# Patient Record
Sex: Female | Born: 1935 | Race: White | Hispanic: No | Marital: Married | Smoking: Never smoker
Health system: Southern US, Community
[De-identification: ages and names within clinical notes are randomized; demographics above are authoritative.]

## PROBLEM LIST (undated history)

## (undated) DIAGNOSIS — J984 Other disorders of lung: Secondary | ICD-10-CM

## (undated) DIAGNOSIS — G4730 Sleep apnea, unspecified: Secondary | ICD-10-CM

## (undated) DIAGNOSIS — F32A Depression, unspecified: Secondary | ICD-10-CM

## (undated) DIAGNOSIS — M1990 Unspecified osteoarthritis, unspecified site: Secondary | ICD-10-CM

## (undated) DIAGNOSIS — R911 Solitary pulmonary nodule: Secondary | ICD-10-CM

## (undated) DIAGNOSIS — Z8719 Personal history of other diseases of the digestive system: Secondary | ICD-10-CM

## (undated) DIAGNOSIS — E559 Vitamin D deficiency, unspecified: Secondary | ICD-10-CM

## (undated) DIAGNOSIS — K2270 Barrett's esophagus without dysplasia: Secondary | ICD-10-CM

## (undated) DIAGNOSIS — R519 Headache, unspecified: Secondary | ICD-10-CM

## (undated) DIAGNOSIS — B029 Zoster without complications: Secondary | ICD-10-CM

## (undated) DIAGNOSIS — R1013 Epigastric pain: Secondary | ICD-10-CM

## (undated) DIAGNOSIS — E785 Hyperlipidemia, unspecified: Secondary | ICD-10-CM

## (undated) DIAGNOSIS — K635 Polyp of colon: Secondary | ICD-10-CM

## (undated) DIAGNOSIS — M549 Dorsalgia, unspecified: Secondary | ICD-10-CM

## (undated) DIAGNOSIS — G8929 Other chronic pain: Secondary | ICD-10-CM

## (undated) DIAGNOSIS — I10 Essential (primary) hypertension: Secondary | ICD-10-CM

## (undated) DIAGNOSIS — M8080XA Other osteoporosis with current pathological fracture, unspecified site, initial encounter for fracture: Secondary | ICD-10-CM

## (undated) DIAGNOSIS — M40209 Unspecified kyphosis, site unspecified: Secondary | ICD-10-CM

## (undated) DIAGNOSIS — R51 Headache: Secondary | ICD-10-CM

## (undated) DIAGNOSIS — S72009A Fracture of unspecified part of neck of unspecified femur, initial encounter for closed fracture: Secondary | ICD-10-CM

## (undated) DIAGNOSIS — F329 Major depressive disorder, single episode, unspecified: Secondary | ICD-10-CM

## (undated) DIAGNOSIS — K219 Gastro-esophageal reflux disease without esophagitis: Secondary | ICD-10-CM

## (undated) HISTORY — DX: Polyp of colon: K63.5

## (undated) HISTORY — DX: Fracture of unspecified part of neck of unspecified femur, initial encounter for closed fracture: S72.009A

## (undated) HISTORY — DX: Other osteoporosis with current pathological fracture, unspecified site, initial encounter for fracture: M80.80XA

## (undated) HISTORY — DX: Hyperlipidemia, unspecified: E78.5

## (undated) HISTORY — DX: Barrett's esophagus without dysplasia: K22.70

## (undated) HISTORY — DX: Vitamin D deficiency, unspecified: E55.9

## (undated) HISTORY — PX: ESOPHAGOGASTRODUODENOSCOPY (EGD) WITH ESOPHAGEAL DILATION: SHX5812

## (undated) HISTORY — DX: Other chronic pain: G89.29

## (undated) HISTORY — PX: APPENDECTOMY: SHX54

## (undated) HISTORY — PX: DILATION AND CURETTAGE OF UTERUS: SHX78

## (undated) HISTORY — DX: Solitary pulmonary nodule: R91.1

## (undated) HISTORY — PX: TONSILLECTOMY: SUR1361

## (undated) HISTORY — PX: CATARACT EXTRACTION W/ INTRAOCULAR LENS  IMPLANT, BILATERAL: SHX1307

## (undated) HISTORY — DX: Essential (primary) hypertension: I10

## (undated) HISTORY — DX: Zoster without complications: B02.9

## (undated) HISTORY — PX: CHOLECYSTECTOMY OPEN: SUR202

## (undated) HISTORY — PX: TOTAL ABDOMINAL HYSTERECTOMY: SHX209

## (undated) HISTORY — DX: Unspecified kyphosis, site unspecified: M40.209

## (undated) HISTORY — PX: VERTEBROPLASTY: SHX113

## (undated) HISTORY — DX: Gastro-esophageal reflux disease without esophagitis: K21.9

## (undated) HISTORY — DX: Epigastric pain: R10.13

## (undated) HISTORY — PX: FRACTURE SURGERY: SHX138

---

## 1971-05-27 HISTORY — PX: TUBAL LIGATION: SHX77

## 1997-09-04 ENCOUNTER — Other Ambulatory Visit: Admission: RE | Admit: 1997-09-04 | Discharge: 1997-09-04 | Payer: Self-pay | Admitting: Obstetrics and Gynecology

## 1998-08-15 ENCOUNTER — Other Ambulatory Visit: Admission: RE | Admit: 1998-08-15 | Discharge: 1998-08-15 | Payer: Self-pay | Admitting: Gastroenterology

## 1998-09-17 ENCOUNTER — Other Ambulatory Visit: Admission: RE | Admit: 1998-09-17 | Discharge: 1998-09-17 | Payer: Self-pay | Admitting: Obstetrics and Gynecology

## 1999-09-18 ENCOUNTER — Other Ambulatory Visit: Admission: RE | Admit: 1999-09-18 | Discharge: 1999-09-18 | Payer: Self-pay | Admitting: Gastroenterology

## 1999-09-18 ENCOUNTER — Encounter (INDEPENDENT_AMBULATORY_CARE_PROVIDER_SITE_OTHER): Payer: Self-pay | Admitting: Specialist

## 2000-07-16 ENCOUNTER — Encounter: Admission: RE | Admit: 2000-07-16 | Discharge: 2000-07-16 | Payer: Self-pay | Admitting: Family Medicine

## 2000-07-16 ENCOUNTER — Encounter: Payer: Self-pay | Admitting: Family Medicine

## 2000-10-15 ENCOUNTER — Other Ambulatory Visit: Admission: RE | Admit: 2000-10-15 | Discharge: 2000-10-15 | Payer: Self-pay | Admitting: Obstetrics and Gynecology

## 2000-12-23 ENCOUNTER — Encounter (INDEPENDENT_AMBULATORY_CARE_PROVIDER_SITE_OTHER): Payer: Self-pay | Admitting: Specialist

## 2000-12-23 ENCOUNTER — Other Ambulatory Visit: Admission: RE | Admit: 2000-12-23 | Discharge: 2000-12-23 | Payer: Self-pay | Admitting: Gastroenterology

## 2002-12-22 ENCOUNTER — Other Ambulatory Visit: Admission: RE | Admit: 2002-12-22 | Discharge: 2002-12-22 | Payer: Self-pay | Admitting: Obstetrics and Gynecology

## 2004-01-06 ENCOUNTER — Emergency Department (HOSPITAL_COMMUNITY): Admission: EM | Admit: 2004-01-06 | Discharge: 2004-01-06 | Payer: Self-pay | Admitting: Emergency Medicine

## 2004-01-08 ENCOUNTER — Encounter: Admission: RE | Admit: 2004-01-08 | Discharge: 2004-01-08 | Payer: Self-pay | Admitting: Gastroenterology

## 2004-01-25 ENCOUNTER — Ambulatory Visit (HOSPITAL_COMMUNITY): Admission: RE | Admit: 2004-01-25 | Discharge: 2004-01-25 | Payer: Self-pay | Admitting: Gastroenterology

## 2004-01-25 ENCOUNTER — Encounter (INDEPENDENT_AMBULATORY_CARE_PROVIDER_SITE_OTHER): Payer: Self-pay | Admitting: Specialist

## 2004-01-30 ENCOUNTER — Ambulatory Visit (HOSPITAL_COMMUNITY): Admission: RE | Admit: 2004-01-30 | Discharge: 2004-01-30 | Payer: Self-pay | Admitting: Gastroenterology

## 2004-02-07 ENCOUNTER — Encounter: Admission: RE | Admit: 2004-02-07 | Discharge: 2004-02-07 | Payer: Self-pay | Admitting: Orthopedic Surgery

## 2004-02-14 ENCOUNTER — Encounter (INDEPENDENT_AMBULATORY_CARE_PROVIDER_SITE_OTHER): Payer: Self-pay | Admitting: Specialist

## 2004-02-14 ENCOUNTER — Ambulatory Visit (HOSPITAL_COMMUNITY): Admission: RE | Admit: 2004-02-14 | Discharge: 2004-02-15 | Payer: Self-pay | Admitting: Interventional Radiology

## 2004-02-27 ENCOUNTER — Ambulatory Visit (HOSPITAL_COMMUNITY): Admission: RE | Admit: 2004-02-27 | Discharge: 2004-02-27 | Payer: Self-pay | Admitting: Interventional Radiology

## 2004-05-06 ENCOUNTER — Encounter: Admission: RE | Admit: 2004-05-06 | Discharge: 2004-05-06 | Payer: Self-pay | Admitting: Family Medicine

## 2004-05-26 DIAGNOSIS — R911 Solitary pulmonary nodule: Secondary | ICD-10-CM

## 2004-05-26 HISTORY — DX: Solitary pulmonary nodule: R91.1

## 2004-06-05 ENCOUNTER — Ambulatory Visit: Payer: Self-pay | Admitting: Gastroenterology

## 2004-09-12 ENCOUNTER — Ambulatory Visit: Payer: Self-pay | Admitting: Gastroenterology

## 2004-09-26 ENCOUNTER — Ambulatory Visit: Payer: Self-pay | Admitting: Gastroenterology

## 2004-11-13 ENCOUNTER — Encounter: Admission: RE | Admit: 2004-11-13 | Discharge: 2004-11-13 | Payer: Self-pay | Admitting: Family Medicine

## 2004-12-25 ENCOUNTER — Ambulatory Visit: Payer: Self-pay | Admitting: Gastroenterology

## 2004-12-30 ENCOUNTER — Ambulatory Visit (HOSPITAL_COMMUNITY): Admission: RE | Admit: 2004-12-30 | Discharge: 2004-12-30 | Payer: Self-pay | Admitting: Gastroenterology

## 2005-12-25 ENCOUNTER — Ambulatory Visit: Payer: Self-pay | Admitting: Gastroenterology

## 2006-01-30 ENCOUNTER — Encounter (INDEPENDENT_AMBULATORY_CARE_PROVIDER_SITE_OTHER): Payer: Self-pay | Admitting: Specialist

## 2006-01-30 ENCOUNTER — Ambulatory Visit: Payer: Self-pay | Admitting: Gastroenterology

## 2006-11-02 ENCOUNTER — Encounter: Admission: RE | Admit: 2006-11-02 | Discharge: 2006-11-02 | Payer: Self-pay | Admitting: Internal Medicine

## 2009-01-03 ENCOUNTER — Encounter (INDEPENDENT_AMBULATORY_CARE_PROVIDER_SITE_OTHER): Payer: Self-pay | Admitting: *Deleted

## 2009-02-14 ENCOUNTER — Encounter: Admission: RE | Admit: 2009-02-14 | Discharge: 2009-02-14 | Payer: Self-pay | Admitting: Gastroenterology

## 2009-06-26 ENCOUNTER — Encounter: Admission: RE | Admit: 2009-06-26 | Discharge: 2009-06-26 | Payer: Self-pay | Admitting: Internal Medicine

## 2009-09-21 ENCOUNTER — Encounter (INDEPENDENT_AMBULATORY_CARE_PROVIDER_SITE_OTHER): Payer: Self-pay | Admitting: *Deleted

## 2009-10-24 HISTORY — PX: ORIF HIP FRACTURE: SHX2125

## 2009-11-17 ENCOUNTER — Inpatient Hospital Stay (HOSPITAL_COMMUNITY): Admission: EM | Admit: 2009-11-17 | Discharge: 2009-11-20 | Payer: Self-pay | Admitting: Emergency Medicine

## 2010-01-15 ENCOUNTER — Encounter: Admission: RE | Admit: 2010-01-15 | Discharge: 2010-02-15 | Payer: Self-pay | Admitting: Specialist

## 2010-06-15 ENCOUNTER — Encounter: Payer: Self-pay | Admitting: Interventional Radiology

## 2010-06-25 NOTE — Letter (Signed)
Summary: Endo/Colon Letter  [facility]  [address] [place], [place] [address]   Phone: [phone]  Fax: [phone]      September 21, 2009 MRN: [id number]   [facility] [address] [place], [place]  [address]   Dear Ms. Toves,   According to your medical record, it is time for you to schedule an Endoscopy/Colonoscopy . Endoscopic screeening is recommended for patients with certain upper digestive tract conditions because of associated increased risk for cancers of the upper digestive system. The American Cancer Society recommends Colonoscopy as a method to detect early colon cancer. Patients with a family history of colon cancer, or a personal history of colon polyps or inflammatory bowel disease are at increased risk.  This letter has been generated based on the recommendations made at the time of your prior procedure. If you feel that in your particular situation this may no longer apply, please contact our office.  Please call our office at [phone] to schedule this appointment or to update your records at your earliest convenience.  Thank you for cooperating with [place] to provide you with the very best care possible.   Sincerely,  Judie Petit T. Russella Dar, M.D.  [facility] Gastro[facility]ology [facility] [phone]

## 2010-07-23 ENCOUNTER — Other Ambulatory Visit: Payer: Self-pay | Admitting: Gastroenterology

## 2010-08-11 LAB — CBC
HCT: 30.1 % — ABNORMAL LOW (ref 36.0–46.0)
HCT: 37.5 % (ref 36.0–46.0)
Hemoglobin: 10.5 g/dL — ABNORMAL LOW (ref 12.0–15.0)
Hemoglobin: 12.3 g/dL (ref 12.0–15.0)
MCH: 31.1 pg (ref 26.0–34.0)
MCH: 32.6 pg (ref 26.0–34.0)
MCH: 32.6 pg (ref 26.0–34.0)
MCHC: 32.8 g/dL (ref 30.0–36.0)
MCHC: 34.9 g/dL (ref 30.0–36.0)
MCHC: 35 g/dL (ref 30.0–36.0)
MCHC: 35.6 g/dL (ref 30.0–36.0)
MCV: 91.6 fL (ref 78.0–100.0)
MCV: 93.3 fL (ref 78.0–100.0)
MCV: 93.4 fL (ref 78.0–100.0)
MCV: 94.8 fL (ref 78.0–100.0)
Platelets: 131 10*3/uL — ABNORMAL LOW (ref 150–400)
Platelets: 147 10*3/uL — ABNORMAL LOW (ref 150–400)
Platelets: 176 10*3/uL (ref 150–400)
RBC: 3.23 MIL/uL — ABNORMAL LOW (ref 3.87–5.11)
RBC: 3.42 MIL/uL — ABNORMAL LOW (ref 3.87–5.11)
RDW: 12.6 % (ref 11.5–15.5)
RDW: 12.7 % (ref 11.5–15.5)
WBC: 10.1 10*3/uL (ref 4.0–10.5)
WBC: 6.9 10*3/uL (ref 4.0–10.5)

## 2010-08-11 LAB — URINE CULTURE: Culture: NO GROWTH

## 2010-08-11 LAB — BASIC METABOLIC PANEL
BUN: 21 mg/dL (ref 6–23)
BUN: 9 mg/dL (ref 6–23)
CO2: 27 mEq/L (ref 19–32)
CO2: 31 mEq/L (ref 19–32)
CO2: 31 mEq/L (ref 19–32)
Calcium: 8.3 mg/dL — ABNORMAL LOW (ref 8.4–10.5)
Calcium: 9 mg/dL (ref 8.4–10.5)
Calcium: 9.8 mg/dL (ref 8.4–10.5)
Chloride: 103 mEq/L (ref 96–112)
Chloride: 103 mEq/L (ref 96–112)
Creatinine, Ser: 0.88 mg/dL (ref 0.4–1.2)
GFR calc Af Amer: 56 mL/min — ABNORMAL LOW (ref >60)
GFR calc Af Amer: >60 mL/min (ref >60)
GFR calc Af Amer: >60 mL/min (ref >60)
Glucose, Bld: 100 mg/dL — ABNORMAL HIGH (ref 70–99)
Glucose, Bld: 105 mg/dL — ABNORMAL HIGH (ref 70–99)
Glucose, Bld: 113 mg/dL — ABNORMAL HIGH (ref 70–99)
Potassium: 3.8 mEq/L (ref 3.5–5.1)
Sodium: 138 mEq/L (ref 135–145)
Sodium: 139 mEq/L (ref 135–145)

## 2010-08-11 LAB — DIFFERENTIAL
Basophils Absolute: 0 10*3/uL (ref 0.0–0.1)
Eosinophils Absolute: 0 10*3/uL (ref 0.0–0.7)
Eosinophils Relative: 0 % (ref 0–5)
Lymphs Abs: 1.2 10*3/uL (ref 0.7–4.0)
Neutro Abs: 8.4 10*3/uL — ABNORMAL HIGH (ref 1.7–7.7)

## 2010-08-11 LAB — COMPREHENSIVE METABOLIC PANEL
Albumin: 2.8 g/dL — ABNORMAL LOW (ref 3.5–5.2)
Alkaline Phosphatase: 50 U/L (ref 39–117)
BUN: 8 mg/dL (ref 6–23)
Calcium: 8.5 mg/dL (ref 8.4–10.5)
Glucose, Bld: 95 mg/dL (ref 70–99)
Potassium: 3.7 mEq/L (ref 3.5–5.1)
Sodium: 137 mEq/L (ref 135–145)
Total Protein: 5.5 g/dL — ABNORMAL LOW (ref 6.0–8.3)

## 2010-08-11 LAB — TYPE AND SCREEN: ABO/RH(D): B NEG

## 2010-08-11 LAB — PROTIME-INR: Prothrombin Time: 13.5 seconds (ref 11.6–15.2)

## 2010-08-11 LAB — URINALYSIS, ROUTINE W REFLEX MICROSCOPIC
Ketones, ur: NEGATIVE mg/dL
Protein, ur: NEGATIVE mg/dL
Urobilinogen, UA: 0.2 mg/dL (ref 0.0–1.0)

## 2010-08-25 HISTORY — PX: TOE SURGERY: SHX1073

## 2010-08-25 HISTORY — PX: BUNIONECTOMY: SHX129

## 2010-10-11 NOTE — Assessment & Plan Note (Signed)
[facility]                           GASTROENTEROLOGY OFFICE NOTE   Kim Colon                       MRN:          [id number]  DATE:12/25/2005                            DOB:          10/24/1932    [name] returns for problems with burning post prandial epigastric pain  and occasional painful burning along her left costal margin into her  epigastrium which is not associated with meals or bowel movements.  It  occasionally is exacerbated by movement and relates to her ongoing back  pain.  She has no dysphagia, odynophagia, weight loss, or anorexia.  She  does have a history of Barrett esophagus, and she is very concerned about  esophageal cancer and gastric cancer.  The last upper endoscopy was  performed in September 2005, and showed a 5-cm hiatal hernia, no evidence of  Barrett's, and a small nodule which turned out to be chronic gastritis.  Colonoscopy was performed in May 2006, which showed diverticulosis and  internal hemorrhoids.   CURRENT MEDICATIONS:  Listed on the chart updated reviewed.   MEDICATION ALLERGIES:  1.  BIAXIN.  2.  PRILOSEC.  3.  NEXIUM.   PHYSICAL EXAMINATION:  GENERAL:  No acute distress, anxious.  VITAL SIGNS:  Weight 118 pounds up 4 pounds since August 2006, blood  pressure is 140/72, pulse 80 and regular.  CHEST:  Clear to auscultation bilaterally.  There is tenderness over her  left lateral and anterior costal margin.  CARDIAC:  Regular rate and rhythm without murmurs.  ABDOMEN:  Soft with mild epigastric tenderness to deep palpation.  No  rebound or guarding.  No palpable organomegaly, masses, or hernias.  Normoactive bowel sounds.   ASSESSMENT/PLAN:  1.  Post prandial burning epigastric pain.  This is likely related to      gastroesophageal reflux disease and her hiatal hernia.  Reassess for      Barrett esophagus, ulcer disease, erosive esophagitis, and other      disorders.  Continue Prevacid 30  mg orally twice a day along with      standard anti-reflux measures.  2.  Left costal margin pain that radiates to the epigastrium and is      associated with back pain.      This appears to be musculoskeletal in nature.  She will follow up with      Dr. Clelia Croft for further evaluation.                                   Venita Lick. Pleas Koch., MD, Clementeen Graham   MTS/MedQ  DD:  12/26/2005  DT:  12/26/2005  Job #:  [id number]   cc:   Kari Baars, MD

## 2010-12-13 ENCOUNTER — Telehealth: Payer: Self-pay

## 2010-12-13 NOTE — Telephone Encounter (Signed)
Called to patient to schedule Upper Endoscopy/Colonoscopy and patient states she switched GI care to Dr. Ewing Schlein. Note put in the system of GI switch.

## 2011-02-05 ENCOUNTER — Ambulatory Visit (HOSPITAL_COMMUNITY)
Admission: RE | Admit: 2011-02-05 | Discharge: 2011-02-05 | Disposition: A | Discharge status: Home / Self Care | Payer: Medicare Other | Source: Ambulatory Visit | Attending: Internal Medicine | Admitting: Internal Medicine

## 2011-02-05 DIAGNOSIS — R059 Cough, unspecified: Secondary | ICD-10-CM | POA: Insufficient documentation

## 2011-02-05 DIAGNOSIS — J988 Other specified respiratory disorders: Secondary | ICD-10-CM | POA: Insufficient documentation

## 2011-02-05 DIAGNOSIS — R0989 Other specified symptoms and signs involving the circulatory and respiratory systems: Secondary | ICD-10-CM | POA: Insufficient documentation

## 2011-02-05 DIAGNOSIS — R0602 Shortness of breath: Secondary | ICD-10-CM | POA: Insufficient documentation

## 2011-02-05 DIAGNOSIS — R05 Cough: Secondary | ICD-10-CM | POA: Insufficient documentation

## 2011-02-05 DIAGNOSIS — R0609 Other forms of dyspnea: Secondary | ICD-10-CM | POA: Insufficient documentation

## 2011-02-05 LAB — PULMONARY FUNCTION TEST

## 2011-07-29 ENCOUNTER — Encounter: Payer: Self-pay | Admitting: Cardiovascular Disease

## 2011-11-17 ENCOUNTER — Other Ambulatory Visit: Payer: Self-pay | Admitting: Gastroenterology

## 2011-11-17 ENCOUNTER — Ambulatory Visit
Admission: RE | Admit: 2011-11-17 | Discharge: 2011-11-17 | Disposition: A | Discharge status: Home / Self Care | Payer: Medicare Other | Source: Ambulatory Visit | Attending: Gastroenterology | Admitting: Gastroenterology

## 2011-11-17 DIAGNOSIS — R0602 Shortness of breath: Secondary | ICD-10-CM

## 2012-01-05 ENCOUNTER — Encounter: Payer: Self-pay | Admitting: *Deleted

## 2012-01-05 ENCOUNTER — Encounter: Payer: Self-pay | Admitting: Emergency Medicine

## 2012-01-05 ENCOUNTER — Ambulatory Visit (INDEPENDENT_AMBULATORY_CARE_PROVIDER_SITE_OTHER): Payer: Medicare Other | Admitting: Emergency Medicine

## 2012-01-05 VITALS — BP 154/82 | HR 99 | Temp 98.4°F | Ht <= 58 in | Wt 121.8 lb

## 2012-01-05 DIAGNOSIS — J984 Other disorders of lung: Secondary | ICD-10-CM | POA: Insufficient documentation

## 2012-01-05 DIAGNOSIS — K219 Gastro-esophageal reflux disease without esophagitis: Secondary | ICD-10-CM | POA: Insufficient documentation

## 2012-01-05 DIAGNOSIS — G4733 Obstructive sleep apnea (adult) (pediatric): Secondary | ICD-10-CM

## 2012-01-05 DIAGNOSIS — I10 Essential (primary) hypertension: Secondary | ICD-10-CM | POA: Insufficient documentation

## 2012-01-05 NOTE — Patient Instructions (Addendum)
Your oxygen level was normal with walking  We will perform a sleep study to assess your breathing at night while you sleep Follow with Dr Delton Coombes in 2 months or sooner if you have any problems.

## 2012-01-05 NOTE — Progress Notes (Signed)
Subjective:    Patient ID: Kim Colon, female    DOB: [birth year], 78 y.o.   MRN: [id number]  HPI 78 yo never smoker with hx compression fx's, GERD, HTN, hyperlipidemia. Per Dr Alver Fisher notes, also with a pulmonary nodule from 10/2004.  She has been having some episodes of sudden awakening, feeling like she isn't moving air. This started happening about a year ago. She does have a hx of reflux a moderate sized hiatal hernia, reflux has come into her mouth at night - but the episodes now don't feel the same, aren't associated with a sour taste, etc. She has SOB with exertion that has evolved gradually. Her PFT in 10/2010 showed no AFL, some mild restriction, decreased diffusion that corrects for Va. Her husband says she snores. She is tired during the day - never refreshed in the am.    Review of Systems  Constitutional: Positive for fatigue. Negative for fever, chills, diaphoresis, activity change, appetite change and unexpected weight change.  HENT: Positive for trouble swallowing. Negative for nosebleeds, congestion, sore throat, rhinorrhea, sneezing, voice change, postnasal drip and sinus pressure.   Eyes: Negative for photophobia and visual disturbance.  Respiratory: Positive for cough, choking, chest tightness, shortness of breath and wheezing. Negative for apnea and stridor.   Cardiovascular: Negative for chest pain.  Gastrointestinal: Positive for abdominal pain. Negative for nausea, vomiting, constipation and blood in stool.  Genitourinary: Negative for dysuria, urgency, frequency and difficulty urinating.  Musculoskeletal: Positive for back pain. Negative for joint swelling and gait problem.  Skin: Negative for rash.  Neurological: Positive for light-headedness. Negative for dizziness, tremors, seizures, weakness, numbness and headaches.  Hematological: Does not bruise/bleed easily.  Psychiatric/Behavioral: Negative for confusion, disturbed wake/sleep cycle and agitation. The patient is  nervous/anxious.        Objective:   Physical Exam Filed Vitals:   01/05/12 1526  BP: 154/82  Pulse: 99  Temp: 98.4 F (36.9 C)   Gen: Pleasant, short stature, kyphotic, in no distress,  normal affect  ENT: No lesions,  mouth clear,  oropharynx clear, no postnasal drip  Neck: No JVD, no TMG, no carotid bruits  Lungs: No use of accessory muscles, no dullness to percussion, clear without rales or rhonchi  Cardiovascular: RRR, heart sounds normal, no murmur, consistent S4  Musculoskeletal: No deformities, no cyanosis or clubbing  Neuro: alert, non focal  Skin: Warm, no lesions or rashes   PFT 02/05/11:  FEV1 1.39 (87%), FVC 1.74 (80%), ratio 80%; no BD response TLC 75%, RV 72% DLCO 62%, DLCO/VA 86%   CXR 11/17/11: Findings: Lung volumes are low. No acute consolidative airspace  disease. Linear area of scarring in the right middle lobe is  unchanged. No pleural effusions. Pulmonary vasculature is normal.  Heart size is normal. Moderate sized hiatal hernia. Mediastinal  contours are otherwise unremarkable. Lateral view demonstrates  diffuse osteopenia of the spine with multiple vertebral body  compression fractures (T7, T8, T10, T11, T12 and L1), with post  vertebroplasty changes noted in T7. Overall appearance is very  similar to the sagittal reconstructions from CT of the thorax dated  11/02/2006. Surgical clips project over the right upper quadrant  of the abdomen, consistent with prior cholecystectomy.  IMPRESSION:  1. No radiographic evidence of acute cardiopulmonary disease.  2. Small area of scarring in the right middle lobe is unchanged.  3. Moderate sized hiatal hernia.  4. Osteopenia with multiple vertebral body compression fractures  redemonstrated, as above.  5. Status post cholecystectomy.  Assessment & Plan:  Restrictive lung disease Likely due to her hiatal hernia and her evolving kyphosis. Unfortunately not much to be done to address these  underlying causes. No AFL on her PFT from last year, but she did have mild restriction at that time.  - will insure that she is adequately oxygenated today  Obstructive sleep apnea Suspected based on hx. Also consider effects of nocturnal GERD with the hiatal hernia - she believes the overt GERD sx are better - sleep study; will then need to discuss CPAP (she isn't thrilled at this prospect)

## 2012-01-05 NOTE — Assessment & Plan Note (Signed)
Likely due to her hiatal hernia and her evolving kyphosis. Unfortunately not much to be done to address these underlying causes. No AFL on her PFT from last year, but she did have mild restriction at that time.  - will insure that she is adequately oxygenated today

## 2012-01-05 NOTE — Assessment & Plan Note (Signed)
Suspected based on hx. Also consider effects of nocturnal GERD with the hiatal hernia - she believes the overt GERD sx are better - sleep study; will then need to discuss CPAP (she isn't thrilled at this prospect)

## 2012-01-12 ENCOUNTER — Ambulatory Visit (HOSPITAL_COMMUNITY)
Admission: RE | Admit: 2012-01-12 | Discharge: 2012-01-12 | Disposition: A | Discharge status: Home / Self Care | Payer: Medicare Other | Source: Ambulatory Visit | Attending: Gastroenterology | Admitting: Gastroenterology

## 2012-01-12 DIAGNOSIS — R1310 Dysphagia, unspecified: Secondary | ICD-10-CM | POA: Insufficient documentation

## 2012-01-12 NOTE — Procedures (Signed)
Objective Swallowing Evaluation: Modified Barium Swallowing Study  Patient Details  Name: Kim Colon MRN: [id number] Date of Birth: 06-Jan-1933  Today's Date: 01/12/2012 Time: 4098-1191 SLP Time Calculation (min): 25 min  Past Medical History:  Past Medical History  Diagnosis Date  . Osteoporosis with fracture     T12,T11, T10  . Hip fracture     left  . H/O vertebroplasty     T8  . Shingles   . Colon polyps   . Hyperlipemia   . HTN (hypertension)   . Vitamin D deficiency disease   . GERD (gastroesophageal reflux disease)   . Barrett esophagus   . Pulmonary nodule 2006  . Chronic epigastric pain   . Kyphosis    Past Surgical History:  Past Surgical History  Procedure Date  . Orif hip fracture 10/2009  . Cholecystectomy   . Appendectomy   . Total abdominal hysterectomy   . Bilateral salpingoophorectomy   . Bunionectomy 08/2010    Right foot  . Toe surgery    HPI:  Pt is a 78 year old female arriving for an outpatient MBS due to a history of GERD, irritation of throat, coughing with eating and drinking, Hiatal hernia, increased secretions/chest congestion. Pt referred by GI due to concerns that pt may be aspirating per report. Pt reports she has had episode of a sour tasking reflux coming up to her mouth and she sometimes feels irritated and has trouble breathing.      Assessment / Plan / Recommendation Clinical Impression  Dysphagia Diagnosis: Suspected primary esophageal dysphagia Clinical impression: Pt presents with a normal oral and oropharyngeal swallow. No aspiration or penetration observed. Esophageal sweep revealed previously diagnosed hiatal hernia, appearance of wide, open esophageal column. Given history and complaints pt with a high risk of laryngopharyngeal reflux. Esophageal precautions provided to pt. Recommend she continue a regular diet with thin liquids with f/u with Dr. Ewing Schlein.     Treatment Recommendation  No treatment recommended at this time      Diet Recommendation Regular;Thin liquid   Liquid Administration via: Cup;Straw Medication Administration: Whole meds with liquid Supervision: Patient able to self feed Compensations: Follow solids with liquid Postural Changes and/or Swallow Maneuvers: Seated upright 90 degrees;Upright 30-60 min after meal    Other  Recommendations     Follow Up Recommendations  None    Frequency and Duration        Pertinent Vitals/Pain NA    SLP Swallow Goals     General HPI: Pt is a 78 year old female arriving for an outpatient MBS due to a history of GERD, irritation of throat, coughing with eating and drinking, Hiatal hernia, increased secretions/chest congestion. Pt referred by GI due to concerns that pt may be aspirating per report. Pt reports she has had episode of a sour tasking reflux coming up to her mouth and she sometimes feels irritated and has trouble breathing.  Type of Study: Modified Barium Swallowing Study Reason for Referral: Objectively evaluate swallowing function Diet Prior to this Study: Regular;Thin liquids Temperature Spikes Noted: N/A Respiratory Status: Room air History of Recent Intubation: No Behavior/Cognition: Alert;Cooperative;Pleasant mood Oral Cavity - Dentition: Adequate natural dentition Oral Motor / Sensory Function: Within functional limits Self-Feeding Abilities: Able to feed self Patient Positioning: Upright in chair Baseline Vocal Quality: Clear Volitional Cough: Strong Volitional Swallow: Able to elicit Anatomy: Within functional limits Pharyngeal Secretions: Not observed secondary MBS    Reason for Referral Objectively evaluate swallowing function   Oral Phase  Pharyngeal Phase Pharyngeal Phase: Within functional limits   Cervical Esophageal Phase    GO Functional Assessment Tool Used: clinical judgement Functional Limitations: Swallowing Swallow Current Status ([id number]): At least 1 percent but less than 20 percent impaired, limited or  restricted Swallow Discharge Status [phone]): At least 1 percent but less than 20 percent impaired, limited or restricted  Cervical Esophageal Phase: Impaired (Prominent CP, asymptomatic.See clinical impression statement)    Kim Colon, Riley Nearing 01/12/2012, 1:18 PM

## 2012-02-02 ENCOUNTER — Encounter (HOSPITAL_BASED_OUTPATIENT_CLINIC_OR_DEPARTMENT_OTHER): Payer: Medicare Other

## 2012-02-04 ENCOUNTER — Other Ambulatory Visit: Payer: Self-pay | Admitting: Gastroenterology

## 2012-02-04 DIAGNOSIS — R109 Unspecified abdominal pain: Secondary | ICD-10-CM

## 2012-02-10 ENCOUNTER — Ambulatory Visit
Admission: RE | Admit: 2012-02-10 | Discharge: 2012-02-10 | Disposition: A | Discharge status: Home / Self Care | Payer: Medicare Other | Source: Ambulatory Visit | Attending: Gastroenterology | Admitting: Gastroenterology

## 2012-02-10 DIAGNOSIS — R109 Unspecified abdominal pain: Secondary | ICD-10-CM

## 2012-02-10 MED ORDER — IOHEXOL 300 MG/ML  SOLN
100.0000 mL | Freq: Once | INTRAMUSCULAR | Status: AC | PRN
  Administered 2012-02-10: 100 mL via INTRAVENOUS

## 2012-03-08 ENCOUNTER — Ambulatory Visit (INDEPENDENT_AMBULATORY_CARE_PROVIDER_SITE_OTHER): Payer: Medicare Other | Admitting: Emergency Medicine

## 2012-03-08 ENCOUNTER — Encounter: Payer: Self-pay | Admitting: Emergency Medicine

## 2012-03-08 VITALS — BP 140/80 | HR 85 | Temp 98.3°F | Ht 59.0 in | Wt 122.4 lb

## 2012-03-08 DIAGNOSIS — J984 Other disorders of lung: Secondary | ICD-10-CM

## 2012-03-08 DIAGNOSIS — G4733 Obstructive sleep apnea (adult) (pediatric): Secondary | ICD-10-CM

## 2012-03-08 NOTE — Progress Notes (Signed)
Subjective:    Patient ID: Kim Colon, female    DOB: November 24, 1932, 78 y.o.   MRN: [id number]  HPI 78 yo never smoker with hx compression fx's, GERD, HTN, hyperlipidemia. Per Dr Alver Fisher notes, also with a pulmonary nodule from 10/2004.  She has been having some episodes of sudden awakening, feeling like she isn't moving air. This started happening about a year ago. She does have a hx of reflux a moderate sized hiatal hernia, reflux has come into her mouth at night - but the episodes now don't feel the same, aren't associated with a sour taste, etc. She has SOB with exertion that has evolved gradually. Her PFT in 10/2010 showed no AFL, some mild restriction, decreased diffusion that corrects for Va. Her husband says she snores. She is tired during the day - never refreshed in the am.   ROV 03/08/12 -- follows for her nocturnal dyspnea, awakenings - restrictive dz, hiatal hernia. Swallowing eval since last time questions primary esophageal dysphagia, no overt aspiration. She hasn't had sleep study yet - has wondered if problems are primarily GI. Her nocturnal awakenings have decreased, following w Dr Ewing Schlein.    Review of Systems  Constitutional: Positive for fatigue. Negative for fever, chills, diaphoresis, activity change, appetite change and unexpected weight change.  HENT: Positive for trouble swallowing. Negative for nosebleeds, congestion, sore throat, rhinorrhea, sneezing, voice change, postnasal drip and sinus pressure.   Eyes: Negative for photophobia and visual disturbance.  Respiratory: Positive for cough, choking, chest tightness, shortness of breath and wheezing. Negative for apnea and stridor.   Cardiovascular: Negative for chest pain.  Gastrointestinal: Positive for abdominal pain. Negative for nausea, vomiting, constipation and blood in stool.  Genitourinary: Negative for dysuria, urgency, frequency and difficulty urinating.  Musculoskeletal: Positive for back pain. Negative for joint  swelling and gait problem.  Skin: Negative for rash.  Neurological: Positive for light-headedness. Negative for dizziness, tremors, seizures, weakness, numbness and headaches.  Hematological: Does not bruise/bleed easily.  Psychiatric/Behavioral: Negative for confusion, disturbed wake/sleep cycle and agitation. The patient is nervous/anxious.        Objective:   Physical Exam Filed Vitals:   03/08/12 0857  BP: 140/80  Pulse: 85  Temp: 98.3 F (36.8 C)   Gen: Pleasant, short stature, kyphotic, in no distress,  normal affect  ENT: No lesions,  mouth clear,  oropharynx clear, no postnasal drip  Neck: No JVD, no TMG, no carotid bruits  Lungs: No use of accessory muscles, no dullness to percussion, clear without rales or rhonchi  Cardiovascular: RRR, heart sounds normal, no murmur, consistent S4  Musculoskeletal: No deformities, no cyanosis or clubbing  Neuro: alert, non focal  Skin: Warm, no lesions or rashes   PFT 02/05/11:  FEV1 1.39 (87%), FVC 1.74 (80%), ratio 80%; no BD response TLC 75%, RV 72% DLCO 62%, DLCO/VA 86%   CXR 11/17/11: Findings: Lung volumes are low. No acute consolidative airspace  disease. Linear area of scarring in the right middle lobe is  unchanged. No pleural effusions. Pulmonary vasculature is normal.  Heart size is normal. Moderate sized hiatal hernia. Mediastinal  contours are otherwise unremarkable. Lateral view demonstrates  diffuse osteopenia of the spine with multiple vertebral body  compression fractures (T7, T8, T10, T11, T12 and L1), with post  vertebroplasty changes noted in T7. Overall appearance is very  similar to the sagittal reconstructions from CT of the thorax dated  11/02/2006. Surgical clips project over the right upper quadrant  of the abdomen, consistent  with prior cholecystectomy.  IMPRESSION:  1. No radiographic evidence of acute cardiopulmonary disease.  2. Small area of scarring in the right middle lobe is unchanged.    3. Moderate sized hiatal hernia.  4. Osteopenia with multiple vertebral body compression fractures  redemonstrated, as above.  5. Status post cholecystectomy.      Assessment & Plan:  Obstructive sleep apnea I still suspect this may be present, she believes it is more GI sx from her hiatal hernia. We agreed to defer PSG for now but to order it if she continues to have nocturnal awakenings, etc. She will call either me or Dr Clelia Croft.   Restrictive lung disease Little to do to modify this. Need to get her in best cardiopulm conditioning we can.

## 2012-03-08 NOTE — Assessment & Plan Note (Signed)
I still suspect this may be present, she believes it is more GI sx from her hiatal hernia. We agreed to defer PSG for now but to order it if she continues to have nocturnal awakenings, etc. She will call either me or Dr Clelia Croft.

## 2012-03-08 NOTE — Assessment & Plan Note (Signed)
Little to do to modify this. Need to get her in best cardiopulm conditioning we can.

## 2012-03-08 NOTE — Patient Instructions (Addendum)
Continue to follow with Dr Ewing Schlein regarding your hiatal hernia, swallowing  and reflux We will not order the sleep test right now. If you continue to wake up at night with choking then call either Dr Clelia Croft or Dr Delton Coombes and we will set it up.  Follow with Dr Delton Coombes as needed

## 2012-11-21 ENCOUNTER — Emergency Department (HOSPITAL_COMMUNITY)
Admission: EM | Admit: 2012-11-21 | Discharge: 2012-11-22 | Disposition: A | Discharge status: Home / Self Care | Payer: Medicare Other | Attending: Emergency Medicine | Admitting: Emergency Medicine

## 2012-11-21 ENCOUNTER — Encounter (HOSPITAL_COMMUNITY): Payer: Self-pay | Admitting: Emergency Medicine

## 2012-11-21 DIAGNOSIS — Z860100 Personal history of colon polyps, unspecified: Secondary | ICD-10-CM | POA: Insufficient documentation

## 2012-11-21 DIAGNOSIS — Y92009 Unspecified place in unspecified non-institutional (private) residence as the place of occurrence of the external cause: Secondary | ICD-10-CM | POA: Insufficient documentation

## 2012-11-21 DIAGNOSIS — G8929 Other chronic pain: Secondary | ICD-10-CM | POA: Insufficient documentation

## 2012-11-21 DIAGNOSIS — K2270 Barrett's esophagus without dysplasia: Secondary | ICD-10-CM | POA: Insufficient documentation

## 2012-11-21 DIAGNOSIS — Z8619 Personal history of other infectious and parasitic diseases: Secondary | ICD-10-CM | POA: Insufficient documentation

## 2012-11-21 DIAGNOSIS — M549 Dorsalgia, unspecified: Secondary | ICD-10-CM

## 2012-11-21 DIAGNOSIS — M542 Cervicalgia: Secondary | ICD-10-CM

## 2012-11-21 DIAGNOSIS — S22009A Unspecified fracture of unspecified thoracic vertebra, initial encounter for closed fracture: Secondary | ICD-10-CM | POA: Insufficient documentation

## 2012-11-21 DIAGNOSIS — R296 Repeated falls: Secondary | ICD-10-CM | POA: Insufficient documentation

## 2012-11-21 DIAGNOSIS — S0993XA Unspecified injury of face, initial encounter: Secondary | ICD-10-CM | POA: Insufficient documentation

## 2012-11-21 DIAGNOSIS — Z8781 Personal history of (healed) traumatic fracture: Secondary | ICD-10-CM | POA: Insufficient documentation

## 2012-11-21 DIAGNOSIS — Z8739 Personal history of other diseases of the musculoskeletal system and connective tissue: Secondary | ICD-10-CM | POA: Insufficient documentation

## 2012-11-21 DIAGNOSIS — Z79899 Other long term (current) drug therapy: Secondary | ICD-10-CM | POA: Insufficient documentation

## 2012-11-21 DIAGNOSIS — I10 Essential (primary) hypertension: Secondary | ICD-10-CM | POA: Insufficient documentation

## 2012-11-21 DIAGNOSIS — E559 Vitamin D deficiency, unspecified: Secondary | ICD-10-CM | POA: Insufficient documentation

## 2012-11-21 DIAGNOSIS — IMO0002 Reserved for concepts with insufficient information to code with codable children: Secondary | ICD-10-CM | POA: Insufficient documentation

## 2012-11-21 DIAGNOSIS — S22050A Wedge compression fracture of T5-T6 vertebra, initial encounter for closed fracture: Secondary | ICD-10-CM

## 2012-11-21 DIAGNOSIS — K219 Gastro-esophageal reflux disease without esophagitis: Secondary | ICD-10-CM | POA: Insufficient documentation

## 2012-11-21 DIAGNOSIS — S0990XA Unspecified injury of head, initial encounter: Secondary | ICD-10-CM | POA: Insufficient documentation

## 2012-11-21 DIAGNOSIS — Z862 Personal history of diseases of the blood and blood-forming organs and certain disorders involving the immune mechanism: Secondary | ICD-10-CM | POA: Insufficient documentation

## 2012-11-21 DIAGNOSIS — Z8639 Personal history of other endocrine, nutritional and metabolic disease: Secondary | ICD-10-CM | POA: Insufficient documentation

## 2012-11-21 DIAGNOSIS — W19XXXA Unspecified fall, initial encounter: Secondary | ICD-10-CM

## 2012-11-21 DIAGNOSIS — S199XXA Unspecified injury of neck, initial encounter: Secondary | ICD-10-CM | POA: Insufficient documentation

## 2012-11-21 DIAGNOSIS — Y9389 Activity, other specified: Secondary | ICD-10-CM | POA: Insufficient documentation

## 2012-11-21 DIAGNOSIS — S322XXA Fracture of coccyx, initial encounter for closed fracture: Secondary | ICD-10-CM | POA: Insufficient documentation

## 2012-11-21 DIAGNOSIS — S3210XA Unspecified fracture of sacrum, initial encounter for closed fracture: Secondary | ICD-10-CM | POA: Insufficient documentation

## 2012-11-21 NOTE — ED Notes (Signed)
Pt fell outside in garden, tripped over a dog. Pt c/o low back and sacral pain radiating up back. Pt able to stand w/ assistance.

## 2012-11-21 NOTE — ED Provider Notes (Signed)
History    CSN: [id number] Arrival date & time 11/21/12  2114  First MD Initiated Contact with Patient 11/21/12 2208     Chief Complaint  Patient presents with  . Tailbone Pain   (Consider location/radiation/quality/duration/timing/severity/associated sxs/prior Treatment) The history is provided by the patient and medical records. No language interpreter was used.    Kim Colon is a 79 y.o. female  with a hx of ORIF of the hip, osteoporosis, thoracic compression fractures  presents to the Emergency Department complaining of acute persistent neck and back pain.  Associated symptoms include headache and tailbone pain.  Pt states she was standing in the yard and when the dog tried to jump on her she fell backwards, landing flat on her back.  She is having pain in her neck and tailbone radiating up and down her spine.  Laying still makes it better and movement makes it significantly worse.  Pt denies fever, chills, LOC, chest pain, abd pain, SOB, N/V/D, weakness, dizziness, syncope.     Past Medical History  Diagnosis Date  . Osteoporosis with fracture     T12,T11, T10  . Hip fracture     left  . H/O vertebroplasty     T8  . Shingles   . Colon polyps   . Hyperlipemia   . HTN (hypertension)   . Vitamin D deficiency disease   . GERD (gastroesophageal reflux disease)   . Barrett esophagus   . Pulmonary nodule 2006  . Chronic epigastric pain   . Kyphosis    Past Surgical History  Procedure Laterality Date  . Orif hip fracture  10/2009  . Cholecystectomy    . Appendectomy    . Total abdominal hysterectomy    . Bilateral salpingoophorectomy    . Bunionectomy  08/2010    Right foot  . Toe surgery     Family History  Problem Relation Age of Onset  . Heart failure Father   . Cancer Father     prostate  . Heart attack Mother   . Parkinsonism Brother   . Stroke Sister   . Heart attack Sister   . Diabetes type II Sister   . Osteopenia Daughter   . Osteopenia Daughter     History  Substance Use Topics  . Smoking status: Never Smoker   . Smokeless tobacco: Never Used  . Alcohol Use: No   OB History   Grav Para Term Preterm Abortions TAB SAB Ect Mult Living                 Review of Systems  Constitutional: Negative for fever, diaphoresis, appetite change, fatigue and unexpected weight change.  HENT: Positive for neck pain. Negative for mouth sores and neck stiffness.   Eyes: Negative for visual disturbance.  Respiratory: Negative for cough, chest tightness, shortness of breath and wheezing.   Cardiovascular: Negative for chest pain.  Gastrointestinal: Negative for nausea, vomiting, abdominal pain, diarrhea and constipation.  Endocrine: Negative for polydipsia, polyphagia and polyuria.  Genitourinary: Negative for dysuria, urgency, frequency and hematuria.  Musculoskeletal: Positive for back pain and arthralgias.  Skin: Negative for rash.  Allergic/Immunologic: Negative for immunocompromised state.  Neurological: Positive for headaches. Negative for syncope and light-headedness.  Hematological: Does not bruise/bleed easily.  Psychiatric/Behavioral: Negative for sleep disturbance. The patient is not nervous/anxious.     Allergies  Biaxin  Home Medications   Current Outpatient Rx  Name  Route  Sig  Dispense  Refill  . acetaminophen (TYLENOL) 500 MG  tablet   Oral   Take 500 mg by mouth every 6 (six) hours as needed for pain.          Marland Kitchen alum & mag hydroxide-simeth (MAALOX/MYLANTA) 200-200-20 MG/5ML suspension   Oral   Take 5 mLs by mouth every 6 (six) hours as needed for indigestion.         . calcium carbonate (TUMS - DOSED IN MG ELEMENTAL CALCIUM) 500 MG chewable tablet   Oral   Chew 1 tablet by mouth daily as needed for heartburn.         . Calcium Carbonate-Vitamin D (CALCIUM-D) 600-400 MG-UNIT TABS   Oral   Take 1 capsule by mouth 2 (two) times daily.         . Cholecalciferol (VITAMIN D3) 1000 UNITS CAPS   Oral   Take 1  capsule by mouth 2 (two) times daily.         . clonazePAM (KLONOPIN) 0.5 MG tablet   Oral   Take 0.25 mg by mouth At bedtime as needed (sleep).          . fish oil-omega-3 fatty acids 1000 MG capsule   Oral   Take 1 g by mouth every morning.         . hydrochlorothiazide (HYDRODIURIL) 25 MG tablet   Oral   Take 25 mg by mouth every morning.          Marland Kitchen lisinopril (PRINIVIL,ZESTRIL) 10 MG tablet   Oral   Take 10 mg by mouth 2 (two) times daily.          . Multiple Vitamins-Minerals (COMPLETE MULTIVITAMIN/MINERAL PO)   Oral   Take 1 capsule by mouth every morning.          Marland Kitchen omeprazole (PRILOSEC) 40 MG capsule   Oral   Take 40 mg by mouth 2 (two) times daily.          Marland Kitchen pyridOXINE (VITAMIN B-6) 100 MG tablet   Oral   Take 100 mg by mouth every morning.          . vitamin B-12 (CYANOCOBALAMIN) 500 MCG tablet   Oral   Take 500 mcg by mouth every morning.          . Vitamin D, Ergocalciferol, (DRISDOL) 50000 UNITS CAPS   Oral   Take 50,000 Units by mouth every 30 (thirty) days.          BP 145/91  Pulse 99  Temp(Src) 99 F (37.2 C) (Oral)  Resp 20  SpO2 95% Physical Exam  Nursing note and vitals reviewed. Constitutional: She is oriented to person, place, and time. She appears well-developed and well-nourished. No distress.  HENT:  Head: Normocephalic and atraumatic.  Mouth/Throat: Oropharynx is clear and moist. No oropharyngeal exudate.  Eyes: Conjunctivae and EOM are normal. Pupils are equal, round, and reactive to light.  Neck: Neck supple. No rigidity. Decreased range of motion (with pain) present.  Cardiovascular: Normal rate, regular rhythm, normal heart sounds and intact distal pulses.   No murmur heard. Pulmonary/Chest: Effort normal and breath sounds normal. No respiratory distress. She has no wheezes.  Abdominal: Soft. She exhibits no distension. There is no tenderness.  Musculoskeletal: She exhibits tenderness. She exhibits no edema.   Full range of motion of the T-spine and L-spine Mild generalized tenderness to palpation of the spinous processes of the C-spine, T-spine and L-spine - no pinpoint pain. Small hematoma noted to the L flank; no pain to palpation in the area  Pt with significant pain on movement of her body from one position to the other Positive straight leg raise on the L and R   Lymphadenopathy:    She has no cervical adenopathy.  Neurological: She is alert and oriented to person, place, and time. She has normal reflexes. No cranial nerve deficit. She exhibits normal muscle tone. Coordination normal.  Speech is clear and goal oriented, follows commands Normal strength in upper and lower extremities bilaterally including dorsiflexion and plantar flexion, strong and equal grip strength Sensation normal to light and sharp touch Moves extremities without ataxia, coordination intact  Skin: Skin is warm and dry. No rash noted. She is not diaphoretic. No erythema.  Psychiatric: She has a normal mood and affect. Her behavior is normal.    ED Course  Procedures (including critical care time) Labs Reviewed - No data to display Ct Head Wo Contrast  11/22/2012   *RADIOLOGY REPORT*  Clinical Data:  Fall.  Head trauma.  Neck pain.  CT HEAD WITHOUT CONTRAST CT CERVICAL SPINE WITHOUT CONTRAST  Technique:  Multidetector CT imaging of the head and cervical spine was performed following the standard protocol without intravenous contrast.  Multiplanar CT image reconstructions of the cervical spine were also generated.  Comparison:   None  CT HEAD  Findings: Calvarium intact.  Paranasal sinuses and mastoid air cells are within normal limits. No mass lesion, mass effect, midline shift, hydrocephalus, hemorrhage.  No acute territorial cortical ischemia/infarct. Atrophy and chronic ischemic white matter disease is present.  Normal variant coaptation of the right occipital horn of the lateral ventricle.  IMPRESSION: No acute  intracranial abnormality.  Atrophy and chronic ischemic white matter disease.  CT CERVICAL SPINE  Findings: There is mild dextroconvex torticollis.  C5-C6 predominant degenerative disc disease is present.  Endplate cystic changes noted.  Posterior osseous ridging at C2-C3.  There is no cervical spine fracture or dislocation.  The odontoid is intact. Occipital condyles appear within normal limits.  Bilateral pleural apical scarring in the lungs.  IMPRESSION: No acute abnormality.  C5-C6 predominant cervical spondylosis.   Original Report Authenticated By: Andreas Newport, M.D.   1. Fall at home, initial encounter   2. Headache   3. Neck pain   4. Back pain   5. H/O compression fracture of spine     MDM  Mariajose S Pontiff presents with back pain after fall.  Concern for new fractures of the back. Will CT head and spine.  Pt declines pain medication several times.  Dr. Margarita Grizzle was consulted, evaluated this patient with me and agrees with the plan.    1:33 AM Imaging pending.  Discussed with Kaylyn Lim who will follow and dispo accordingly.      Dahlia Client Veretta Sabourin, PA-C 11/22/12 [phone]

## 2012-11-22 ENCOUNTER — Emergency Department (HOSPITAL_COMMUNITY): Payer: Medicare Other

## 2012-11-22 MED ORDER — HYDROCODONE-ACETAMINOPHEN 5-325 MG PO TABS: 1.0000 | ORAL_TABLET | ORAL | Status: DC | PRN

## 2012-11-22 MED ORDER — MORPHINE SULFATE 2 MG/ML IJ SOLN
2.0000 mg | Freq: Once | INTRAMUSCULAR | Status: AC
  Administered 2012-11-22: 2 mg via INTRAMUSCULAR
  Filled 2012-11-22: qty 1

## 2012-11-22 MED ORDER — SENNOSIDES-DOCUSATE SODIUM 8.6-50 MG PO TABS: 1.0000 | ORAL_TABLET | Freq: Every day | ORAL | Status: DC

## 2012-11-22 MED ORDER — ACETAMINOPHEN 500 MG PO TABS
1000.0000 mg | ORAL_TABLET | Freq: Once | ORAL | Status: AC
  Administered 2012-11-22: 1000 mg via ORAL
  Filled 2012-11-22: qty 2

## 2012-11-22 NOTE — ED Notes (Signed)
[employer] enroute. Estimate 2h to prepare the brace and arrive at the ED.

## 2012-11-22 NOTE — ED Notes (Signed)
Pt was able to ambulate but with two assist.  Pt sts she is still has back pain.  Pt sts she still feels unsteady without a walker or cane.

## 2012-11-22 NOTE — ED Notes (Signed)
[employer] here to place TLSO brace

## 2012-11-22 NOTE — ED Provider Notes (Signed)
History/physical exam/procedure(s) were performed by non-physician practitioner and as supervising physician I was immediately available for consultation/collaboration. I have reviewed all notes and am in agreement with care and plan.   Damen Windsor S Toyia Jelinek, MD 11/22/12 1205 

## 2012-11-22 NOTE — ED Notes (Addendum)
Ortho called. They stated that [employer] needed to come fit her for the brace. They have been notified.

## 2012-11-22 NOTE — ED Provider Notes (Signed)
Sign out from PA Muthersbaugh at shift change: Kim Colon is a 79 y.o. female past medical history significant for osteoporosis and thoracic compression fractures presenting to the ED with sacral, cervical and thoracic back pain status post slip and fall earlier in the day. Plan is to follow up CT imaging.  Addition to sacral fracture, there is a new T5 compression fracture with no retropulsion.   Patient can ambulate with assistance, her mobility is limited by her pain however she is very hesitant to take pain medicine because she "does not want to become addicted" and had a bad experience in the past for causes severe case of constipation. After convincing the patient to take acetaminophen and then morphine 2 mg IM she is able to ambulate with more ease. Patient will be cared for at home by her husband. I have advised her to use a walker. We have had extensive discussions of fall precautions.  Pt is hemodynamically stable, appropriate for, and amenable to discharge at this time. Pt verbalized understanding and agrees with care plan. Outpatient follow-up and specific return precautions discussed.    New Prescriptions   HYDROCODONE-ACETAMINOPHEN (NORCO/VICODIN) 5-325 MG PER TABLET    Take 1 tablet by mouth every 4 (four) hours as needed for pain.   SENNA-DOCUSATE (SENOKOT-S) 8.6-50 MG PER TABLET    Take 1 tablet by mouth daily.      Wynetta Emery, PA-C 11/22/12 [phone]

## 2012-11-22 NOTE — ED Provider Notes (Signed)
Medical screening examination/treatment/procedure(s) were performed by non-physician practitioner and as supervising physician I was immediately available for consultation/collaboration.   Dione Booze, MD 11/22/12 [phone]

## 2014-05-09 ENCOUNTER — Encounter: Payer: Self-pay | Admitting: Emergency Medicine

## 2014-05-09 ENCOUNTER — Ambulatory Visit (INDEPENDENT_AMBULATORY_CARE_PROVIDER_SITE_OTHER): Payer: Medicare Other | Admitting: Emergency Medicine

## 2014-05-09 VITALS — BP 128/72 | HR 78 | Ht 59.0 in | Wt 116.0 lb

## 2014-05-09 DIAGNOSIS — G4733 Obstructive sleep apnea (adult) (pediatric): Secondary | ICD-10-CM | POA: Diagnosis not present

## 2014-05-09 NOTE — Assessment & Plan Note (Signed)
High suspicion based on sx and anatomy. She would like to find out how much out of pocket cost before definitely performing. Will order today.

## 2014-05-09 NOTE — Progress Notes (Signed)
Subjective:    Patient ID: Kim Colon, female DOB: Apr 15, 1933, 80 y.o. MRN: [id number]  HPI 80 yo never smoker with hx compression fx's, GERD, HTN, hyperlipidemia. Per Dr Alver FisherShaw's notes, also with a pulmonary nodule from 10/2004. She has been having some episodes of sudden awakening, feeling like she isn't moving air. This started happening about a year ago. She does have a hx of reflux a moderate sized hiatal hernia, reflux has come into her mouth at night - but the episodes now don't feel the same, aren't associated with a sour taste, etc. She has SOB with exertion that has evolved gradually. Her PFT in 10/2010 showed no AFL, some mild restriction, decreased diffusion that corrects for Va. Her husband says she snores. She is tired during the day - never refreshed in the am.   ROV 03/08/12 -- follows for her nocturnal dyspnea, awakenings - restrictive dz, hiatal hernia. Swallowing eval since last time questions primary esophageal dysphagia, no overt aspiration. She hasn't had sleep study yet - has wondered if problems are primarily GI. Her nocturnal awakenings have decreased, following w Dr Ewing SchleinMagod.  ROV 05/09/14 -- follow up visit for nocturnal SOB. Last seen here in 02/2012.  She has restrictive lung disease, restriction and a hiatal hernia. Also, we have been suspicious for OSA > never had a PSG to date. She still has nocturnal awakenings, feels SOB when she wakes up at night. She does not taste  Or feel acid at those times. She was seen by [employer] nurse recommended to her that she follow up with us again. She states that she has SOB with bending and with some exertion.   Past Medical History  Diagnosis Date  . Osteoporosis with fracture     T12,T11, T10  . Hip fracture     left  . H/O vertebroplasty     T8  . Shingles   . Colon polyps   . Hyperlipemia   . HTN (hypertension)   . Vitamin D deficiency disease   . GERD (gastroesophageal reflux disease)   . Barrett esophagus    . Pulmonary nodule 2006  . Chronic epigastric pain   . Kyphosis      Family History  Problem Relation Age of Onset  . Heart failure Father   . Cancer Father     prostate  . Heart attack Mother   . Parkinsonism Brother   . Stroke Sister   . Heart attack Sister   . Diabetes type II Sister   . Osteopenia Daughter   . Osteopenia Daughter      History   Social History  . Marital Status: Married    Spouse Name: N/A    Number of Children: 3  . Years of Education: N/A   Occupational History  .      farming/housewife   Social History Main Topics  . Smoking status: Never Smoker   . Smokeless tobacco: Never Used  . Alcohol Use: No  . Drug Use: No  . Sexual Activity: Not on file   Other Topics Concern  . Not on file   Social History Narrative     Allergies  Allergen Reactions  . Amlodipine     Ankle swelling  . Biaxin [Clarithromycin] Nausea Only     Outpatient Prescriptions Prior to Visit  Medication Sig Dispense Refill  . acetaminophen (TYLENOL) 500 MG tablet Take 500 mg by mouth every 6 (six) hours as needed for pain.     Marland Kitchen. alum &  mag hydroxide-simeth (MAALOX/MYLANTA) 200-200-20 MG/5ML suspension Take 5 mLs by mouth every 6 (six) hours as needed for indigestion.    . calcium carbonate (TUMS - DOSED IN MG ELEMENTAL CALCIUM) 500 MG chewable tablet Chew 1 tablet by mouth daily as needed for heartburn.    . Calcium Carbonate-Vitamin D (CALCIUM-D) 600-400 MG-UNIT TABS Take 1 capsule by mouth 2 (two) times daily.    . clonazePAM (KLONOPIN) 0.5 MG tablet Take 0.25 mg by mouth At bedtime as needed (sleep).     . fish oil-omega-3 fatty acids 1000 MG capsule Take 1 g by mouth every morning.    . hydrochlorothiazide (HYDRODIURIL) 25 MG tablet Take 25 mg by mouth every morning.     Marland Kitchen. lisinopril (PRINIVIL,ZESTRIL) 10 MG tablet Take 10 mg by mouth 2 (two) times daily.     . Multiple Vitamins-Minerals (COMPLETE MULTIVITAMIN/MINERAL PO) Take 1 capsule by mouth every morning.      Marland Kitchen. omeprazole (PRILOSEC) 40 MG capsule Take 40 mg by mouth daily.     Marland Kitchen. pyridOXINE (VITAMIN B-6) 100 MG tablet Take 100 mg by mouth every morning.     . vitamin B-12 (CYANOCOBALAMIN) 500 MCG tablet Take 500 mcg by mouth every morning.     . Vitamin D, Ergocalciferol, (DRISDOL) 50000 UNITS CAPS Take 50,000 Units by mouth every 30 (thirty) days.    . Cholecalciferol (VITAMIN D3) 1000 UNITS CAPS Take 1 capsule by mouth daily.     Marland Kitchen. HYDROcodone-acetaminophen (NORCO/VICODIN) 5-325 MG per tablet Take 1 tablet by mouth every 4 (four) hours as needed for pain. (Patient not taking: Reported on 05/09/2014) 15 tablet 0  . senna-docusate (SENOKOT-S) 8.6-50 MG per tablet Take 1 tablet by mouth daily. (Patient not taking: Reported on 05/09/2014) 30 tablet 0   No facility-administered medications prior to visit.    Filed Vitals:   05/09/14 1519  BP: 128/72  Pulse: 78  Height: 4\' 11"  (1.499 m)  Weight: 116 lb (52.617 kg)  SpO2: 97%   Gen: Pleasant, kyphotic, in no distress,  normal affect  ENT: No lesions,  mouth clear,  oropharynx clear, no postnasal drip  Neck: No JVD, no TMG, no carotid bruits  Lungs: No use of accessory muscles, decrease especially at L base, no crackles or wheezes.   Cardiovascular: RRR, heart sounds normal, no murmur or gallops, no peripheral edema  Musculoskeletal: No deformities, no cyanosis or clubbing  Neuro: alert, non focal  Skin: Warm, no lesions or rashes   Obstructive sleep apnea High suspicion based on sx and anatomy. She would like to find out how much out of pocket cost before definitely performing. Will order today.

## 2014-05-09 NOTE — Patient Instructions (Signed)
We will arrange for a sleep study to assess your breathing at night We will not start any new medications at this time.  Follow with Dr Delton CoombesByrum in 6 weeks or sooner if you have any problems

## 2014-07-04 ENCOUNTER — Ambulatory Visit: Payer: Medicare Other | Admitting: Emergency Medicine

## 2014-08-01 ENCOUNTER — Ambulatory Visit (HOSPITAL_BASED_OUTPATIENT_CLINIC_OR_DEPARTMENT_OTHER): Payer: Medicare Other | Attending: Emergency Medicine | Admitting: Radiology

## 2014-08-01 VITALS — Ht 59.0 in | Wt 119.0 lb

## 2014-08-01 DIAGNOSIS — G4733 Obstructive sleep apnea (adult) (pediatric): Secondary | ICD-10-CM | POA: Insufficient documentation

## 2014-08-08 ENCOUNTER — Ambulatory Visit: Payer: Self-pay | Admitting: Emergency Medicine

## 2014-08-09 DIAGNOSIS — G4733 Obstructive sleep apnea (adult) (pediatric): Secondary | ICD-10-CM

## 2014-08-09 NOTE — Sleep Study (Signed)
[facility]  NAME: Kim Colon DATE OF BIRTH:  03/01/1933 MEDICAL RECORD NUMBER [id number]  LOCATION: [facility]  PHYSICIAN: Coralyn Helling, M.D. DATE OF STUDY: 08/01/2014  SLEEP STUDY TYPE: Polysomnogram               REFERRING PHYSICIAN: Leslye Peer, MD  INDICATION FOR STUDY:  Kim Colon is a 81 y.o. female who presents to the sleep lab for evaluation of hypersomnia with obstructive sleep apnea.  She reports snoring, sleep disruption, apnea, and daytime sleepiness.  EPWORTH SLEEPINESS SCORE: 6. HEIGHT:  (149.9 cm)  WEIGHT: 119 lb (53.978 kg)    Body mass index is 24.02 kg/(m^2).  NECK SIZE: 12.5 in.  MEDICATIONS:  Current Outpatient Prescriptions on File Prior to Visit  Medication Sig Dispense Refill  . acetaminophen (TYLENOL) 500 MG tablet Take 500 mg by mouth every 6 (six) hours as needed for pain.     Marland Kitchen alum & mag hydroxide-simeth (MAALOX/MYLANTA) 200-200-20 MG/5ML suspension Take 5 mLs by mouth every 6 (six) hours as needed for indigestion.    . calcium carbonate (TUMS - DOSED IN MG ELEMENTAL CALCIUM) 500 MG chewable tablet Chew 1 tablet by mouth daily as needed for heartburn.    . Calcium Carbonate-Vitamin D (CALCIUM-D) 600-400 MG-UNIT TABS Take 1 capsule by mouth 2 (two) times daily.    . Cholecalciferol (VITAMIN D3) 1000 UNITS CAPS Take 1 capsule by mouth daily.     . clonazePAM (KLONOPIN) 0.5 MG tablet Take 0.25 mg by mouth At bedtime as needed (sleep).     . fish oil-omega-3 fatty acids 1000 MG capsule Take 1 g by mouth every morning.    . hydrochlorothiazide (HYDRODIURIL) 25 MG tablet Take 25 mg by mouth every morning.     Marland Kitchen lisinopril (PRINIVIL,ZESTRIL) 10 MG tablet Take 10 mg by mouth 2 (two) times daily.     . Multiple Vitamins-Minerals (COMPLETE MULTIVITAMIN/MINERAL PO) Take 1 capsule by mouth every morning.     Marland Kitchen omeprazole (PRILOSEC) 40 MG capsule Take 40 mg by mouth daily.     Marland Kitchen pyridOXINE (VITAMIN B-6) 100  MG tablet Take 100 mg by mouth every morning.     . vitamin B-12 (CYANOCOBALAMIN) 500 MCG tablet Take 500 mcg by mouth every morning.     . Vitamin D, Ergocalciferol, (DRISDOL) 50000 UNITS CAPS Take 50,000 Units by mouth every 30 (thirty) days.     No current facility-administered medications on file prior to visit.    SLEEP ARCHITECTURE:  Total recording time: 357.5 minutes.  Total sleep time was: 23 minutes.  Sleep efficiency: 6.4%.  Sleep latency: 38 minutes.  REM latency: N/A minutes.  Stage N1: 34.8%.  Stage N2: 65.2%.  Stage N3: N/A%.  Stage R:  N/A%.  Supine sleep: 0 minutes.  Non-supine sleep: 23 minutes.  CARDIAC DATA:  Average heart rate: 86 beats per minute. Rhythm strip: sinus rhythm with PACs.  RESPIRATORY DATA: Average respiratory rate: 16. Snoring: Moderate. Average AHI: 10.4.   Apnea index: 2.6.  Hypopnea index: 7.8. Obstructive apnea index: 0.  Central apnea index: 2.6.  Mixed apnea index: 0. REM AHI: N/A.  NREM AHI: 10.4. Supine AHI: N/A. Non-supine AHI: 10.4.  MOVEMENT/PARASOMNIA:  Periodic limb movement: 0.  Period limb movements with arousals: 0. Restroom trips: 2.  OXYGEN DATA:  Baseline oxygenation: 97%. Lowest SaO2: 89%. Time spent below SaO2 90%: 0.3 minutes. Supplemental oxygen used: none.  IMPRESSION/ RECOMMENDATION:   This study is  very suggestive of obstructive sleep apnea.  Her AHI was 10.4 and SaO2 low 89%, but she had very limited sleep time.  It appeared her difficulties with sleep initiation and sleep maintenance were related to respiratory events.  She likely had much more frequent events that were not able to be scored due to insufficient sleep time.  Options are to 1) proceed with trial of CPAP, 2) have patient return to sleep lab for repeat study and sleep with use of sleep aide medication, or 3) arrange for home sleep study to further evaluate.   Coralyn HellingVineet Kylil Swopes, M.D. Diplomate, Biomedical engineerAmerican Board of Sleep Medicine  ELECTRONICALLY SIGNED ON:   08/09/2014, 1:33 PM [facility] PH: [phone]   FX: [phone] ACCREDITED BY THE [employer]ICAN ACADEMY OF SLEEP MEDICINE

## 2014-09-19 ENCOUNTER — Encounter: Payer: Self-pay | Admitting: Emergency Medicine

## 2014-09-19 ENCOUNTER — Ambulatory Visit (INDEPENDENT_AMBULATORY_CARE_PROVIDER_SITE_OTHER): Payer: Medicare Other | Admitting: Emergency Medicine

## 2014-09-19 VITALS — BP 130/66 | HR 77 | Ht 59.0 in | Wt 119.8 lb

## 2014-09-19 DIAGNOSIS — J984 Other disorders of lung: Secondary | ICD-10-CM | POA: Diagnosis not present

## 2014-09-19 DIAGNOSIS — G4733 Obstructive sleep apnea (adult) (pediatric): Secondary | ICD-10-CM | POA: Diagnosis not present

## 2014-09-19 NOTE — Assessment & Plan Note (Addendum)
She has OSA based on her PSG. I reviewed this in detail with her today.. She is hesitant to undergo CPAP titration, not sure she can tolerate it. She wants to wait on this.

## 2014-09-19 NOTE — Assessment & Plan Note (Signed)
She is more concerned today about her exertional SOB. Her HH and her kyphosis, body habitus are huge contributors. We will perform walking oximetry today. Unfortunately the only things that we can do to help with her restrictive lung disease are weight loss, better cardiopulmonary conditioning

## 2014-09-19 NOTE — Progress Notes (Signed)
Subjective:    Patient ID: Kim AldermanAlmeta S Kleine, female DOB: 1932-09-28, 81 y.o. MRN: [id number]  HPI 81 yo never smoker with hx compression fx's, GERD, HTN, hyperlipidemia. Per Dr Alver FisherShaw's notes, also with a pulmonary nodule from 10/2004. She has been having some episodes of sudden awakening, feeling like she isn't moving air. This started happening about a year ago. She does have a hx of reflux a moderate sized hiatal hernia, reflux has come into her mouth at night - but the episodes now don't feel the same, aren't associated with a sour taste, etc. She has SOB with exertion that has evolved gradually. Her PFT in 10/2010 showed no AFL, some mild restriction, decreased diffusion that corrects for Va. Her husband says she snores. She is tired during the day - never refreshed in the am.   ROV 03/08/12 -- follows for her nocturnal dyspnea, awakenings - restrictive dz, hiatal hernia. Swallowing eval since last time questions primary esophageal dysphagia, no overt aspiration. She hasn't had sleep study yet - has wondered if problems are primarily GI. Her nocturnal awakenings have decreased, following w Dr Ewing SchleinMagod.  ROV 05/09/14 -- follow up visit for nocturnal SOB. Last seen here in 02/2012.  She has restrictive lung disease, restriction and a hiatal hernia. Also, we have been suspicious for OSA > never had a PSG to date. She still has nocturnal awakenings, feels SOB when she wakes up at night. She does not taste  Or feel acid at those times. She was seen by [facility] nurse recommended to her that she follow up with us again. She states that she has SOB with bending and with some exertion.   ROV 09/19/14 -- FOllow up visit for shortness of breath occurs particularly at night. We discussed performing a sleep study at her last visit in December. This was performed in March 2016 and showed evidence for obstructive sleep apnea even in light of limited sleep time.  She states that she still has many night time  awakenings. She continues to have exertional SOB with chores, especially w bending.    Filed Vitals:   09/19/14 0941  BP: 130/66  Pulse: 77  Height: 4\' 11"  (1.499 m)  Weight: 119 lb 12.8 oz (54.341 kg)  SpO2: 99%   Gen: Pleasant, kyphotic, in no distress,  normal affect  ENT: No lesions,  mouth clear,  oropharynx clear, no postnasal drip  Neck: No JVD, no TMG, no carotid bruits  Lungs: No use of accessory muscles, decrease especially at L base, no crackles or wheezes.   Cardiovascular: RRR, heart sounds normal, no murmur or gallops, no peripheral edema  Musculoskeletal: No deformities, no cyanosis or clubbing  Neuro: alert, non focal  Skin: Warm, no lesions or rashes   Obstructive sleep apnea She has OSA based on her PSG. I reviewed this in detail with her today.. She is hesitant to undergo CPAP titration, not sure she can tolerate it. She wants to wait on this.    Restrictive lung disease She is more concerned today about her exertional SOB. Her HH and her kyphosis, body habitus are huge contributors. We will perform walking oximetry today. Unfortunately the only things that we can do to help with her restrictive lung disease are weight loss, better cardiopulmonary conditioning

## 2014-09-19 NOTE — Patient Instructions (Signed)
Walking oximetry today Your sleep study showed evidence for obstructive sleep apnea. He would probably benefit from wearing a positive pressure mask to boost your breathing at night while you sleep. We discussed this today. If you decide that you would like to try this please our office know Follow with Dr Delton CoombesByrum in 6 months or sooner if you have any problems

## 2014-10-06 ENCOUNTER — Encounter (HOSPITAL_COMMUNITY): Payer: Self-pay | Admitting: *Deleted

## 2014-10-06 ENCOUNTER — Emergency Department (HOSPITAL_COMMUNITY): Payer: Medicare Other

## 2014-10-06 ENCOUNTER — Emergency Department (HOSPITAL_COMMUNITY)
Admission: EM | Admit: 2014-10-06 | Discharge: 2014-10-06 | Disposition: A | Discharge status: Home / Self Care | Payer: Medicare Other | Attending: Emergency Medicine | Admitting: Emergency Medicine

## 2014-10-06 DIAGNOSIS — Y92096 Garden or yard of other non-institutional residence as the place of occurrence of the external cause: Secondary | ICD-10-CM | POA: Insufficient documentation

## 2014-10-06 DIAGNOSIS — Z8619 Personal history of other infectious and parasitic diseases: Secondary | ICD-10-CM | POA: Diagnosis not present

## 2014-10-06 DIAGNOSIS — Y998 Other external cause status: Secondary | ICD-10-CM | POA: Insufficient documentation

## 2014-10-06 DIAGNOSIS — G8929 Other chronic pain: Secondary | ICD-10-CM | POA: Diagnosis not present

## 2014-10-06 DIAGNOSIS — S22040A Wedge compression fracture of fourth thoracic vertebra, initial encounter for closed fracture: Secondary | ICD-10-CM | POA: Insufficient documentation

## 2014-10-06 DIAGNOSIS — S299XXA Unspecified injury of thorax, initial encounter: Secondary | ICD-10-CM | POA: Diagnosis present

## 2014-10-06 DIAGNOSIS — Z79899 Other long term (current) drug therapy: Secondary | ICD-10-CM | POA: Diagnosis not present

## 2014-10-06 DIAGNOSIS — S0990XA Unspecified injury of head, initial encounter: Secondary | ICD-10-CM | POA: Insufficient documentation

## 2014-10-06 DIAGNOSIS — I10 Essential (primary) hypertension: Secondary | ICD-10-CM | POA: Diagnosis not present

## 2014-10-06 DIAGNOSIS — W01198A Fall on same level from slipping, tripping and stumbling with subsequent striking against other object, initial encounter: Secondary | ICD-10-CM | POA: Diagnosis not present

## 2014-10-06 DIAGNOSIS — K219 Gastro-esophageal reflux disease without esophagitis: Secondary | ICD-10-CM | POA: Insufficient documentation

## 2014-10-06 DIAGNOSIS — Y9389 Activity, other specified: Secondary | ICD-10-CM | POA: Insufficient documentation

## 2014-10-06 DIAGNOSIS — Z86010 Personal history of colonic polyps: Secondary | ICD-10-CM | POA: Diagnosis not present

## 2014-10-06 DIAGNOSIS — Z8739 Personal history of other diseases of the musculoskeletal system and connective tissue: Secondary | ICD-10-CM | POA: Diagnosis not present

## 2014-10-06 DIAGNOSIS — W19XXXA Unspecified fall, initial encounter: Secondary | ICD-10-CM

## 2014-10-06 DIAGNOSIS — E559 Vitamin D deficiency, unspecified: Secondary | ICD-10-CM | POA: Diagnosis not present

## 2014-10-06 MED ORDER — HYDROCODONE-ACETAMINOPHEN 5-325 MG PO TABS: 0.5000 | ORAL_TABLET | ORAL | Status: DC | PRN

## 2014-10-06 NOTE — ED Provider Notes (Signed)
CSN: [id number]     Arrival date & time 10/06/14  1002 History   First MD Initiated Contact with Patient 10/06/14 1006     Chief Complaint  Patient presents with  . Fall     (Consider location/radiation/quality/duration/timing/severity/associated sxs/prior Treatment) HPI Comments: Patient resents to the ER for evaluation after a fall. Patient reports that she was in her yard and was frightened by a lizard, fell backwards. She hit the back of her head and is complaining of pain in the mid and upper back. There was no loss of consciousness. Patient reports the pain is sharp, constant, worsens with movement. No lower back pain. No chest pain, abdominal pain, extremity pain.  Patient is a 81 y.o. female presenting with fall.  Fall Associated symptoms include headaches.    Past Medical History  Diagnosis Date  . Osteoporosis with fracture     T12,T11, T10  . Hip fracture     left  . H/O vertebroplasty     T8  . Shingles   . Colon polyps   . Hyperlipemia   . HTN (hypertension)   . Vitamin D deficiency disease   . GERD (gastroesophageal reflux disease)   . Barrett esophagus   . Pulmonary nodule 2006  . Chronic epigastric pain   . Kyphosis    Past Surgical History  Procedure Laterality Date  . Orif hip fracture  10/2009  . Cholecystectomy    . Appendectomy    . Total abdominal hysterectomy    . Bilateral salpingoophorectomy    . Bunionectomy  08/2010    Right foot  . Toe surgery     Family History  Problem Relation Age of Onset  . Heart failure Father   . Cancer Father     prostate  . Heart attack Mother   . Parkinsonism Brother   . Stroke Sister   . Heart attack Sister   . Diabetes type II Sister   . Osteopenia Daughter   . Osteopenia Daughter    History  Substance Use Topics  . Smoking status: Never Smoker   . Smokeless tobacco: Never Used  . Alcohol Use: No   OB History    No data available     Review of Systems  Musculoskeletal: Positive for back pain.   Neurological: Positive for headaches.  All other systems reviewed and are negative.     Allergies  Amlodipine and Biaxin  Home Medications   Prior to Admission medications   Medication Sig Start Date End Date Taking? Authorizing Provider  acetaminophen (TYLENOL) 500 MG tablet Take 500 mg by mouth every 6 (six) hours as needed for pain.    Yes Historical Provider, MD  alum & mag hydroxide-simeth (MAALOX/MYLANTA) 200-200-20 MG/5ML suspension Take 5 mLs by mouth every 6 (six) hours as needed for indigestion.   Yes Historical Provider, MD  calcium carbonate (TUMS - DOSED IN MG ELEMENTAL CALCIUM) 500 MG chewable tablet Chew 1 tablet by mouth daily as needed for heartburn.   Yes Historical Provider, MD  Calcium Carbonate-Vitamin D (CALCIUM-D) 600-400 MG-UNIT TABS Take 1 capsule by mouth daily.    Yes Historical Provider, MD  Cholecalciferol (VITAMIN D3) 1000 UNITS CAPS Take 1 capsule by mouth daily.    Yes Historical Provider, MD  diclofenac sodium (VOLTAREN) 1 % GEL Apply 1 application topically daily as needed.   Yes Historical Provider, MD  fish oil-omega-3 fatty acids 1000 MG capsule Take 1 g by mouth every morning.   Yes Historical Provider, MD  hydrochlorothiazide (HYDRODIURIL) 25 MG tablet Take 25 mg by mouth every morning.    Yes Historical Provider, MD  lisinopril (PRINIVIL,ZESTRIL) 10 MG tablet Take 10 mg by mouth 2 (two) times daily.    Yes Historical Provider, MD  Multiple Vitamins-Minerals (COMPLETE MULTIVITAMIN/MINERAL PO) Take 1 capsule by mouth every morning.    Yes Historical Provider, MD  NASAL SALINE NA Place 1 spray into the nose daily as needed (dry nose).   Yes Historical Provider, MD  omeprazole (PRILOSEC) 40 MG capsule Take 40 mg by mouth daily.    Yes Historical Provider, MD  pyridOXINE (VITAMIN B-6) 100 MG tablet Take 100 mg by mouth every morning.    Yes Historical Provider, MD  vitamin B-12 (CYANOCOBALAMIN) 500 MCG tablet Take 500 mcg by mouth every morning.    Yes  Historical Provider, MD  Vitamin D, Ergocalciferol, (DRISDOL) 50000 UNITS CAPS Take 50,000 Units by mouth every 30 (thirty) days.   Yes Historical Provider, MD   BP 173/84 mmHg  Pulse 80  Temp(Src) 98 F (36.7 C) (Oral)  Resp 18  SpO2 98% Physical Exam  Constitutional: She is oriented to person, place, and time. She appears well-developed and well-nourished. No distress.  HENT:  Head: Normocephalic and atraumatic.  Right Ear: Hearing normal.  Left Ear: Hearing normal.  Nose: Nose normal.  Mouth/Throat: Oropharynx is clear and moist and mucous membranes are normal.  Eyes: Conjunctivae and EOM are normal. Pupils are equal, round, and reactive to light.  Neck: Normal range of motion. Neck supple.  Cardiovascular: Regular rhythm, S1 normal and S2 normal.  Exam reveals no gallop and no friction rub.   No murmur heard. Pulmonary/Chest: Effort normal and breath sounds normal. No respiratory distress. She exhibits no tenderness.  Abdominal: Soft. Normal appearance and bowel sounds are normal. There is no hepatosplenomegaly. There is no tenderness. There is no rebound, no guarding, no tenderness at McBurney's point and negative Murphy's sign. No hernia.  Musculoskeletal: Normal range of motion.       Thoracic back: She exhibits tenderness.       Back:  Neurological: She is alert and oriented to person, place, and time. She has normal strength. No cranial nerve deficit or sensory deficit. Coordination normal. GCS eye subscore is 4. GCS verbal subscore is 5. GCS motor subscore is 6.  Skin: Skin is warm, dry and intact. No rash noted. No cyanosis.  Psychiatric: She has a normal mood and affect. Her speech is normal and behavior is normal. Thought content normal.  Nursing note and vitals reviewed.   ED Course  Procedures (including critical care time) Labs Review Labs Reviewed - No data to display  Imaging Review Dg Thoracic Spine 2 View  10/06/2014   CLINICAL DATA:  Recent fall. Pain  between shoulder blades. Chronic low back pain. Initial encounter.  EXAM: THORACIC SPINE - 2 VIEW  COMPARISON:  CT of the thoracic spine 11/22/2012  FINDINGS: Twelve rib-bearing thoracic type vertebral bodies are present. Spinal augmentation at T7 and T8 is stable. Previous compression fractures at T5 and T6 are unchanged. A superior endplate fracture of T4 is more prominent than on the prior study. Remote fractures at T10, T11, T12, and L1 are stable. Exaggerated kyphosis is evident through this region.  IMPRESSION: 1. Superior endplate compression fracture at T4 has progressed since the prior study. 2. Compression fractures at T5, T6, T10, T11, T12, and L1 are similar to the prior study. 3. Stable spinal augmentation at T7 and T8.  Electronically Signed   By: Marin Robertshristopher  Mattern M.D.   On: 10/06/2014 14:08   Ct Head Wo Contrast  10/06/2014   CLINICAL DATA:  Fall backwards 3 days ago. Pain along the right side of the back at the level the scapula. Pain extends to the back of the head. No loss of consciousness.  EXAM: CT HEAD WITHOUT CONTRAST  CT CERVICAL SPINE WITHOUT CONTRAST  TECHNIQUE: Multidetector CT imaging of the head and cervical spine was performed following the standard protocol without intravenous contrast. Multiplanar CT image reconstructions of the cervical spine were also generated.  COMPARISON:  CT head and cervical spine 11/22/2012.  FINDINGS: CT HEAD FINDINGS  Mild atrophy and white matter disease is similar to the prior study. No acute cortical infarct, hemorrhage, or mass lesion is present. The ventricles are proportionate to the degree of atrophy and stable. No significant extra-axial fluid collection is present.  No acute extracranial soft tissue injury is evident. Bilateral lens replacements are present. Paranasal sinuses and mastoid air cells are clear. Calvarium is intact.  CT CERVICAL SPINE FINDINGS  Cervical spine is imaged and skull base through T2-3. Vertebral body heights and  alignment are maintained. Disc spaces are maintained. No acute fracture or traumatic subluxation is present. Minimal endplate degenerative change at C5-6 are stable. The soft tissues of the neck are unremarkable. The lung apices are clear.  IMPRESSION: 1. No acute intracranial abnormality or significant interval change. 2. Mild atrophy and white matter disease is stable and normal for age. 3. No acute abnormality of the cervical spine.   Electronically Signed   By: Marin Robertshristopher  Mattern M.D.   On: 10/06/2014 14:03   Ct Cervical Spine Wo Contrast  10/06/2014   CLINICAL DATA:  Fall backwards 3 days ago. Pain along the right side of the back at the level the scapula. Pain extends to the back of the head. No loss of consciousness.  EXAM: CT HEAD WITHOUT CONTRAST  CT CERVICAL SPINE WITHOUT CONTRAST  TECHNIQUE: Multidetector CT imaging of the head and cervical spine was performed following the standard protocol without intravenous contrast. Multiplanar CT image reconstructions of the cervical spine were also generated.  COMPARISON:  CT head and cervical spine 11/22/2012.  FINDINGS: CT HEAD FINDINGS  Mild atrophy and white matter disease is similar to the prior study. No acute cortical infarct, hemorrhage, or mass lesion is present. The ventricles are proportionate to the degree of atrophy and stable. No significant extra-axial fluid collection is present.  No acute extracranial soft tissue injury is evident. Bilateral lens replacements are present. Paranasal sinuses and mastoid air cells are clear. Calvarium is intact.  CT CERVICAL SPINE FINDINGS  Cervical spine is imaged and skull base through T2-3. Vertebral body heights and alignment are maintained. Disc spaces are maintained. No acute fracture or traumatic subluxation is present. Minimal endplate degenerative change at C5-6 are stable. The soft tissues of the neck are unremarkable. The lung apices are clear.  IMPRESSION: 1. No acute intracranial abnormality or  significant interval change. 2. Mild atrophy and white matter disease is stable and normal for age. 3. No acute abnormality of the cervical spine.   Electronically Signed   By: Marin Robertshristopher  Mattern M.D.   On: 10/06/2014 14:03     EKG Interpretation None      MDM   Final diagnoses:  Fall  Fall   T4 compression fracture  Patient presents to the ER for evaluation of head and back pain after a fall. Patient fell backwards,  hitting the back of her head without loss of consciousness. CT head was unremarkable. CT cervical spine no evidence of fracture. Patient complaining of pain in the mid to upper thoracic region. T-spine films does show worsening T4 compression fracture compared to previous. Patient has multiple levels of compression fractures, but it is felt that they're similar to previous. Patient has declined pain medication. She has seen Dr. Danielle DessElsner in the past, will be discharged to follow-up with Dr. Danielle DessElsner in the office, return if her symptoms worsen.    Gilda Creasehristopher J Nazaiah Navarrete, MD 10/06/14 [phone]

## 2014-10-06 NOTE — Discharge Instructions (Signed)
Back, Compression Fracture °A compression fracture happens when a force is put upon the length of your spine. Slipping and falling on your bottom are examples of such a force. When this happens, sometimes the force is great enough to compress the building blocks (vertebral bodies) of your spine. Although this causes a lot of pain, this can usually be treated at home, unless your caregiver feels hospitalization is needed for pain control. °Your backbone (spinal column) is made up of 24 main vertebral bodies in addition to the sacrum and coccyx (see illustration). These are held together by tough fibrous tissues (ligaments) and by support of your muscles. Nerve roots pass through the openings between the vertebrae. A sudden wrenching move, injury, or a fall may cause a compression fracture of one of the vertebral bodies. This may result in back pain or spread of pain into the belly (abdomen), the buttocks, and down the leg into the foot. Pain may also be created by muscle spasm alone. °Large studies have been undertaken to determine the best possible course of action to help your back following injury and also to prevent future problems. The recommendations are as follows. °FOLLOWING A COMPRESSION FRACTURE: °Do the following only if advised by your caregiver.  °· If a back brace has been suggested or provided, wear it as directed. °· Do not stop wearing the back brace unless instructed by your caregiver. °· When allowed to return to regular activities, avoid a sedentary lifestyle. Actively exercise. Sporadic weekend binges of tennis, racquetball, or waterskiing may actually aggravate or create problems, especially if you are not in condition for that activity. °· Avoid sports requiring sudden body movements until you are in condition for them. Swimming and walking are safer activities. °· Maintain good posture. °· Avoid obesity. °· If not already done, you should have a DEXA scan. Based on the results, be treated for  osteoporosis. °FOLLOWING ACUTE (SUDDEN) INJURY: °· Only take over-the-counter or prescription medicines for pain, discomfort, or fever as directed by your caregiver. °· Use bed rest for only the most extreme acute episode. Prolonged bed rest may aggravate your condition. Ice used for acute conditions is effective. Use a large plastic bag filled with ice. Wrap it in a towel. This also provides excellent pain relief. This may be continuous. Or use it for 30 minutes every 2 hours during acute phase, then as needed. Heat for 30 minutes prior to activities is helpful. °· As soon as the acute phase (the time when your back is too painful for you to do normal activities) is over, it is important to resume normal activities and work hardening programs. Back injuries can cause potentially marked changes in lifestyle. So it is important to attack these problems aggressively. °· See your caregiver for continued problems. He or she can help or refer you for appropriate exercises, physical therapy, and work hardening if needed. °· If you are given narcotic medications for your condition, for the next 24 hours do not: °¨ Drive. °¨ Operate machinery or power tools. °¨ Sign legal documents. °· Do not drink alcohol, or take sleeping pills or other medications that may interfere with treatment. °If your caregiver has given you a follow-up appointment, it is very important to keep that appointment. Not keeping the appointment could result in a chronic or permanent injury, pain, and disability. If there is any problem keeping the appointment, you must call back to this facility for assistance.  °SEEK IMMEDIATE MEDICAL CARE IF: °· You develop numbness,   tingling, weakness, or problems with the use of your arms or legs. °· You develop severe back pain not relieved with medications. °· You have changes in bowel or bladder control. °· You have increasing pain in any areas of the body. °Document Released: 05/12/2005 Document Revised:  09/26/2013 Document Reviewed: 12/15/2007 °[employer]® Patient Information ©2015 [employer]. This information is not intended to replace advice given to you by your health care provider. Make sure you discuss any questions you have with your health care provider. ° °

## 2014-10-06 NOTE — ED Notes (Signed)
Pt reports falling backwards on Tuesday. Having pain to right side of back, under shoulder blade and reports pain to back of head. Denies loc.

## 2014-10-16 ENCOUNTER — Telehealth: Payer: Self-pay | Admitting: *Deleted

## 2014-10-16 NOTE — Telephone Encounter (Signed)
Pt states she had surgery 2012 and would like to know the type of metal placed in the foot, due to needing to have a MRI.  Dr. Charlsie Merlesegal states after 6 months post op internal metal fixation is not an issue with a MRI.

## 2014-11-30 ENCOUNTER — Other Ambulatory Visit: Payer: Self-pay | Admitting: Gastroenterology

## 2014-11-30 ENCOUNTER — Ambulatory Visit
Admission: RE | Admit: 2014-11-30 | Discharge: 2014-11-30 | Disposition: A | Discharge status: Home / Self Care | Payer: Medicare Other | Source: Ambulatory Visit | Attending: Gastroenterology | Admitting: Gastroenterology

## 2014-11-30 DIAGNOSIS — R0602 Shortness of breath: Secondary | ICD-10-CM

## 2014-11-30 DIAGNOSIS — R059 Cough, unspecified: Secondary | ICD-10-CM

## 2014-11-30 DIAGNOSIS — R05 Cough: Secondary | ICD-10-CM

## 2014-12-26 ENCOUNTER — Encounter (HOSPITAL_COMMUNITY): Payer: Self-pay | Admitting: Emergency Medicine

## 2014-12-26 ENCOUNTER — Observation Stay (HOSPITAL_COMMUNITY)
Admission: EM | Admit: 2014-12-26 | Discharge: 2014-12-28 | Disposition: A | Discharge status: Home / Self Care | Payer: Medicare Other | Attending: Internal Medicine | Admitting: Internal Medicine

## 2014-12-26 ENCOUNTER — Emergency Department (HOSPITAL_COMMUNITY): Payer: Medicare Other

## 2014-12-26 DIAGNOSIS — R5383 Other fatigue: Secondary | ICD-10-CM | POA: Diagnosis not present

## 2014-12-26 DIAGNOSIS — K219 Gastro-esophageal reflux disease without esophagitis: Secondary | ICD-10-CM | POA: Insufficient documentation

## 2014-12-26 DIAGNOSIS — R072 Precordial pain: Secondary | ICD-10-CM | POA: Diagnosis not present

## 2014-12-26 DIAGNOSIS — R002 Palpitations: Secondary | ICD-10-CM | POA: Insufficient documentation

## 2014-12-26 DIAGNOSIS — R0600 Dyspnea, unspecified: Secondary | ICD-10-CM | POA: Insufficient documentation

## 2014-12-26 DIAGNOSIS — R0609 Other forms of dyspnea: Secondary | ICD-10-CM | POA: Insufficient documentation

## 2014-12-26 DIAGNOSIS — Z8249 Family history of ischemic heart disease and other diseases of the circulatory system: Secondary | ICD-10-CM | POA: Insufficient documentation

## 2014-12-26 DIAGNOSIS — E876 Hypokalemia: Secondary | ICD-10-CM | POA: Diagnosis not present

## 2014-12-26 DIAGNOSIS — R000 Tachycardia, unspecified: Secondary | ICD-10-CM

## 2014-12-26 DIAGNOSIS — I10 Essential (primary) hypertension: Secondary | ICD-10-CM | POA: Diagnosis not present

## 2014-12-26 DIAGNOSIS — E785 Hyperlipidemia, unspecified: Secondary | ICD-10-CM | POA: Insufficient documentation

## 2014-12-26 DIAGNOSIS — R079 Chest pain, unspecified: Principal | ICD-10-CM | POA: Diagnosis present

## 2014-12-26 DIAGNOSIS — R0789 Other chest pain: Secondary | ICD-10-CM | POA: Diagnosis not present

## 2014-12-26 HISTORY — DX: Unspecified osteoarthritis, unspecified site: M19.90

## 2014-12-26 HISTORY — DX: Depression, unspecified: F32.A

## 2014-12-26 HISTORY — DX: Headache, unspecified: R51.9

## 2014-12-26 HISTORY — DX: Other disorders of lung: J98.4

## 2014-12-26 HISTORY — DX: Other chronic pain: G89.29

## 2014-12-26 HISTORY — DX: Dorsalgia, unspecified: M54.9

## 2014-12-26 HISTORY — DX: Headache: R51

## 2014-12-26 HISTORY — DX: Personal history of other diseases of the digestive system: Z87.19

## 2014-12-26 HISTORY — DX: Sleep apnea, unspecified: G47.30

## 2014-12-26 HISTORY — DX: Major depressive disorder, single episode, unspecified: F32.9

## 2014-12-26 LAB — CBC WITH DIFFERENTIAL/PLATELET
Basophils Absolute: 0 10*3/uL (ref 0.0–0.1)
Basophils Relative: 0 % (ref 0–1)
Eosinophils Absolute: 0.1 10*3/uL (ref 0.0–0.7)
Eosinophils Relative: 2 % (ref 0–5)
HEMATOCRIT: 37.6 % (ref 36.0–46.0)
Hemoglobin: 13.3 g/dL (ref 12.0–15.0)
Lymphocytes Relative: 31 % (ref 12–46)
Lymphs Abs: 1.8 10*3/uL (ref 0.7–4.0)
MCH: 32 pg (ref 26.0–34.0)
MCHC: 35.4 g/dL (ref 30.0–36.0)
MCV: 90.6 fL (ref 78.0–100.0)
MONO ABS: 0.4 10*3/uL (ref 0.1–1.0)
Monocytes Relative: 8 % (ref 3–12)
Neutro Abs: 3.4 10*3/uL (ref 1.7–7.7)
Neutrophils Relative %: 59 % (ref 43–77)
Platelets: 235 10*3/uL (ref 150–400)
RBC: 4.15 MIL/uL (ref 3.87–5.11)
RDW: 12.6 % (ref 11.5–15.5)
WBC: 5.7 10*3/uL (ref 4.0–10.5)

## 2014-12-26 LAB — TROPONIN I: Troponin I: <0.03 ng/mL (ref <0.031)

## 2014-12-26 LAB — COMPREHENSIVE METABOLIC PANEL
ALT: 23 U/L (ref 14–54)
ANION GAP: 10 (ref 5–15)
AST: 32 U/L (ref 15–41)
Albumin: 4 g/dL (ref 3.5–5.0)
Alkaline Phosphatase: 59 U/L (ref 38–126)
BUN: 18 mg/dL (ref 6–20)
CHLORIDE: 101 mmol/L (ref 101–111)
CO2: 26 mmol/L (ref 22–32)
Calcium: 9.9 mg/dL (ref 8.9–10.3)
Creatinine, Ser: 0.97 mg/dL (ref 0.44–1.00)
GFR, EST NON AFRICAN AMERICAN: 54 mL/min — AB (ref >60)
Glucose, Bld: 97 mg/dL (ref 65–99)
Potassium: 3.4 mmol/L — ABNORMAL LOW (ref 3.5–5.1)
SODIUM: 137 mmol/L (ref 135–145)
Total Bilirubin: 0.6 mg/dL (ref 0.3–1.2)
Total Protein: 6.5 g/dL (ref 6.5–8.1)

## 2014-12-26 LAB — T4, FREE: FREE T4: 1.12 ng/dL (ref 0.61–1.12)

## 2014-12-26 LAB — URINALYSIS W MICROSCOPIC (NOT AT ARMC)
BILIRUBIN URINE: NEGATIVE
Glucose, UA: NEGATIVE mg/dL
HGB URINE DIPSTICK: NEGATIVE
Ketones, ur: NEGATIVE mg/dL
NITRITE: NEGATIVE
Protein, ur: NEGATIVE mg/dL
SPECIFIC GRAVITY, URINE: 1.006 (ref 1.005–1.030)
UROBILINOGEN UA: 0.2 mg/dL (ref 0.0–1.0)
pH: 5 (ref 5.0–8.0)

## 2014-12-26 LAB — PHOSPHORUS: PHOSPHORUS: 3.9 mg/dL (ref 2.5–4.6)

## 2014-12-26 LAB — TSH: TSH: 1.223 u[IU]/mL (ref 0.350–4.500)

## 2014-12-26 LAB — I-STAT TROPONIN, ED: TROPONIN I, POC: 0 ng/mL (ref 0.00–0.08)

## 2014-12-26 LAB — MAGNESIUM: Magnesium: 1.6 mg/dL — ABNORMAL LOW (ref 1.7–2.4)

## 2014-12-26 LAB — BRAIN NATRIURETIC PEPTIDE: B Natriuretic Peptide: 52.6 pg/mL (ref 0.0–100.0)

## 2014-12-26 MED ORDER — ALUM & MAG HYDROXIDE-SIMETH 200-200-20 MG/5ML PO SUSP: 5.0000 mL | Freq: Every evening | ORAL | Status: DC | PRN

## 2014-12-26 MED ORDER — ACETAMINOPHEN 650 MG RE SUPP: 650.0000 mg | Freq: Four times a day (QID) | RECTAL | Status: DC | PRN

## 2014-12-26 MED ORDER — ALBUTEROL SULFATE (2.5 MG/3ML) 0.083% IN NEBU: 2.5000 mg | INHALATION_SOLUTION | Freq: Four times a day (QID) | RESPIRATORY_TRACT | Status: DC | PRN

## 2014-12-26 MED ORDER — CYANOCOBALAMIN 500 MCG PO TABS
500.0000 ug | ORAL_TABLET | Freq: Every day | ORAL | Status: DC
  Administered 2014-12-27 – 2014-12-28 (×2): 500 ug via ORAL
  Filled 2014-12-26 (×2): qty 1

## 2014-12-26 MED ORDER — OMEGA-3-ACID ETHYL ESTERS 1 G PO CAPS
1.0000 g | ORAL_CAPSULE | Freq: Every day | ORAL | Status: DC
  Administered 2014-12-27 – 2014-12-28 (×2): 1 g via ORAL
  Filled 2014-12-26 (×2): qty 1

## 2014-12-26 MED ORDER — CALCIUM CARBONATE ANTACID 500 MG PO CHEW
1.0000 | CHEWABLE_TABLET | Freq: Every day | ORAL | Status: DC | PRN
  Filled 2014-12-26: qty 1

## 2014-12-26 MED ORDER — ASPIRIN EC 81 MG PO TBEC
81.0000 mg | DELAYED_RELEASE_TABLET | Freq: Every day | ORAL | Status: DC
  Administered 2014-12-26 – 2014-12-28 (×3): 81 mg via ORAL
  Filled 2014-12-26 (×3): qty 1

## 2014-12-26 MED ORDER — PANTOPRAZOLE SODIUM 40 MG PO TBEC
40.0000 mg | DELAYED_RELEASE_TABLET | Freq: Every day | ORAL | Status: DC
  Administered 2014-12-27 – 2014-12-28 (×2): 40 mg via ORAL
  Filled 2014-12-26 (×2): qty 1

## 2014-12-26 MED ORDER — POTASSIUM CHLORIDE CRYS ER 20 MEQ PO TBCR
40.0000 meq | EXTENDED_RELEASE_TABLET | Freq: Once | ORAL | Status: AC
  Administered 2014-12-26: 40 meq via ORAL
  Filled 2014-12-26: qty 2

## 2014-12-26 MED ORDER — HYDROCODONE-ACETAMINOPHEN 5-325 MG PO TABS: 0.5000 | ORAL_TABLET | ORAL | Status: DC | PRN

## 2014-12-26 MED ORDER — LISINOPRIL 10 MG PO TABS
10.0000 mg | ORAL_TABLET | Freq: Two times a day (BID) | ORAL | Status: DC
  Administered 2014-12-26 – 2014-12-28 (×4): 10 mg via ORAL
  Filled 2014-12-26 (×5): qty 1

## 2014-12-26 MED ORDER — CALCIUM CARBONATE-VITAMIN D 500-200 MG-UNIT PO TABS
1.0000 | ORAL_TABLET | Freq: Every day | ORAL | Status: DC
  Administered 2014-12-27 – 2014-12-28 (×2): 1 via ORAL
  Filled 2014-12-26 (×2): qty 1

## 2014-12-26 MED ORDER — VITAMIN B-6 100 MG PO TABS
100.0000 mg | ORAL_TABLET | Freq: Every day | ORAL | Status: DC
  Administered 2014-12-27 – 2014-12-28 (×2): 100 mg via ORAL
  Filled 2014-12-26 (×2): qty 1

## 2014-12-26 MED ORDER — ACETAMINOPHEN 325 MG PO TABS
650.0000 mg | ORAL_TABLET | Freq: Four times a day (QID) | ORAL | Status: DC | PRN
  Administered 2014-12-27 (×2): 650 mg via ORAL
  Filled 2014-12-26 (×3): qty 2

## 2014-12-26 MED ORDER — DICLOFENAC SODIUM 1 % TD GEL
1.0000 "application " | Freq: Every day | TRANSDERMAL | Status: DC | PRN
  Filled 2014-12-26: qty 100

## 2014-12-26 MED ORDER — ENOXAPARIN SODIUM 40 MG/0.4ML ~~LOC~~ SOLN
40.0000 mg | SUBCUTANEOUS | Status: DC
  Filled 2014-12-26 (×3): qty 0.4

## 2014-12-26 MED ORDER — SODIUM CHLORIDE 0.9 % IJ SOLN
3.0000 mL | Freq: Two times a day (BID) | INTRAMUSCULAR | Status: DC
  Administered 2014-12-26 – 2014-12-28 (×4): 3 mL via INTRAVENOUS

## 2014-12-26 MED ORDER — MAGNESIUM OXIDE 400 (241.3 MG) MG PO TABS
400.0000 mg | ORAL_TABLET | Freq: Two times a day (BID) | ORAL | Status: DC
  Administered 2014-12-26 – 2014-12-28 (×4): 400 mg via ORAL
  Filled 2014-12-26 (×5): qty 1

## 2014-12-26 NOTE — ED Provider Notes (Signed)
CSN: [id number]     Arrival date & time 12/26/14  1333 History   First MD Initiated Contact with Patient 12/26/14 1349     Chief Complaint  Patient presents with  . Palpitations     HPI 81 year old female who presents with fatigue, palpitations, difficulty breathing. She has a history of hiatal hernia and GERD, hypertension, hyperlipidemia, and mild restrictive lung disease. Reports several days of feeling unwell with decreased appetite, fatigue, intermittent palpitations, and overall feeling unwell. She has noted gradual worsening of her dyspnea on exertion as well as intermittent nonexertional chest tightness and chest pressure. Pressure noted primarily in the left side of her chest, is nonradiating, nonpleuritic, with no aggravating or alleviating factors that she can recall. Denies fevers, chills, night sweats, weight changes, abdominal pain, nausea, vomiting, diarrhea, melena or hematochezia, or any urinary symptoms. Denies orthopnea or PND. She has been measuring her vital signs at home is noted that she has been tachycardic in the 110 to 120 earlier today. She then came to our ED for evaluation.   Past Medical History  Diagnosis Date  . Osteoporosis with fracture     T12,T11, T10  . Hip fracture     left  . H/O vertebroplasty     T8  . Shingles   . Colon polyps   . Hyperlipemia   . HTN (hypertension)   . Vitamin D deficiency disease   . GERD (gastroesophageal reflux disease)   . Barrett esophagus   . Pulmonary nodule 2006  . Chronic epigastric pain   . Kyphosis    Past Surgical History  Procedure Laterality Date  . Orif hip fracture  10/2009  . Cholecystectomy    . Appendectomy    . Total abdominal hysterectomy    . Bilateral salpingoophorectomy    . Bunionectomy  08/2010    Right foot  . Toe surgery     Family History  Problem Relation Age of Onset  . Heart failure Father   . Cancer Father     prostate  . Heart attack Mother   . Parkinsonism Brother   . Stroke  Sister   . Heart attack Sister   . Diabetes type II Sister   . Osteopenia Daughter   . Osteopenia Daughter    History  Substance Use Topics  . Smoking status: Never Smoker   . Smokeless tobacco: Never Used  . Alcohol Use: No   OB History    No data available     Review of Systems  10/14 systems reviewed and are negative other than those stated in the HPI   Allergies  Amlodipine and Biaxin  Home Medications   Prior to Admission medications   Medication Sig Start Date End Date Taking? Authorizing Provider  hydrochlorothiazide (HYDRODIURIL) 25 MG tablet Take 25 mg by mouth every morning.    Yes Historical Provider, MD  lisinopril (PRINIVIL,ZESTRIL) 10 MG tablet Take 10 mg by mouth 2 (two) times daily.    Yes Historical Provider, MD  omeprazole (PRILOSEC) 40 MG capsule Take 40 mg by mouth 2 (two) times daily.    Yes Historical Provider, MD  alum & mag hydroxide-simeth (MAALOX/MYLANTA) 200-200-20 MG/5ML suspension Take 5 mLs by mouth every 6 (six) hours as needed for indigestion.    Historical Provider, MD  calcium carbonate (TUMS - DOSED IN MG ELEMENTAL CALCIUM) 500 MG chewable tablet Chew 1 tablet by mouth daily as needed for heartburn.    Historical Provider, MD  Calcium Carbonate-Vitamin D (CALCIUM-D) 600-400  MG-UNIT TABS Take 1 capsule by mouth daily.     Historical Provider, MD  Cholecalciferol (VITAMIN D3) 1000 UNITS CAPS Take 1 capsule by mouth daily.     Historical Provider, MD  diclofenac sodium (VOLTAREN) 1 % GEL Apply 1 application topically daily as needed (pain).     Historical Provider, MD  DULoxetine (CYMBALTA) 30 MG capsule Take 30 mg by mouth every evening. 12/20/14   Historical Provider, MD  fish oil-omega-3 fatty acids 1000 MG capsule Take 1 g by mouth every morning.    Historical Provider, MD  HYDROcodone-acetaminophen (NORCO/VICODIN) 5-325 MG per tablet Take 0.5 tablets by mouth every 4 (four) hours as needed for moderate pain. 10/06/14   Orpah Greek, MD   Multiple Vitamins-Minerals (COMPLETE MULTIVITAMIN/MINERAL PO) Take 1 capsule by mouth every morning.     Historical Provider, MD  NASAL SALINE NA Place 1 spray into the nose daily as needed (dry nose).    Historical Provider, MD  pyridOXINE (VITAMIN B-6) 100 MG tablet Take 100 mg by mouth every morning.     Historical Provider, MD  vitamin B-12 (CYANOCOBALAMIN) 500 MCG tablet Take 500 mcg by mouth every morning.     Historical Provider, MD  Vitamin D, Ergocalciferol, (DRISDOL) 50000 UNITS CAPS Take 50,000 Units by mouth every 30 (thirty) days.    Historical Provider, MD   BP 126/72 mmHg  Pulse 83  Temp(Src) 98.2 F (36.8 C) (Oral)  Resp 21  SpO2 96% Physical Exam  Nursing note and vitals reviewed.  Physical Exam  Constitutional: Well developed, well nourished, non-toxic, and in no acute distress.  Head: Normocephalic and atraumatic.  Mouth/Throat: Oropharynx is clear and moist.  Neck: Normal range of motion. Neck supple.  Cardiovascular: near tachycardic rate and regular rhythm.   +2 radial and DP pulses bilaterally. Normal capillary refill. No appreciable murmurs. Trace pedal edema. No appreciable JVD Pulmonary/Chest: Effort normal and breath sounds normal.  no wheezes rales or rhonchi. No chest tenderness. Abdominal: Soft. There is no tenderness. There is no rebound and no guarding.  Musculoskeletal: Normal range of motion.  Neurological: Alert, no facial droop, fluent speech, moves all extremities symmetrically Skin: Skin is warm and dry.  Psychiatric: Cooperative  ED Course  Procedures (including critical care time) Labs Review Labs Reviewed  URINALYSIS W MICROSCOPIC - Abnormal; Notable for the following:    Leukocytes, UA TRACE (*)    All other components within normal limits  MAGNESIUM - Abnormal; Notable for the following:    Magnesium 1.6 (*)    All other components within normal limits  COMPREHENSIVE METABOLIC PANEL - Abnormal; Notable for the following:    Potassium  3.4 (*)    GFR calc non Af Amer 54 (*)    All other components within normal limits  CBC WITH DIFFERENTIAL/PLATELET  PHOSPHORUS  BRAIN NATRIURETIC PEPTIDE  TSH  T4, FREE  I-STAT TROPOININ, ED    Imaging Review Dg Chest 2 View  12/26/2014   CLINICAL DATA:  81 year old female with chest tightness for several days. Shortness breath. Subsequent encounter.  EXAM: CHEST  2 VIEW  COMPARISON:  11/30/2014 chest x-ray.  FINDINGS: Elevation right hemidiaphragm unchanged with right base subsegmental atelectasis. No segmental consolidation.  Central pulmonary vascular prominence without pulmonary edema.  No pneumothorax.  Thoracic kyphosis with several thoracic compression deformities similar to most recent exam. Prior cement augmentation mid thoracic vertebra.  Tortuous aorta.  Hiatal hernia.  IMPRESSION: Right base subsegmental atelectasis.  No segmental consolidation.  Central pulmonary  vascular prominence without pulmonary edema.  Thoracic kyphosis with several thoracic compression deformities similar to most recent exam. Prior cement augmentation mid thoracic vertebra.  Tortuous aorta.  Hiatal hernia.   Electronically Signed   By: Genia Del M.D.   On: 12/26/2014 15:12     EKG Interpretation   Date/Time:  Tuesday December 26 2014 13:40:36 EDT Ventricular Rate:  106 PR Interval:  126 QRS Duration: 78 QT Interval:  340 QTC Calculation: 451 R Axis:   129 Text Interpretation:  Sinus tachycardia Left posterior fascicular block  Confirmed by Brayten Komar MD, Hinton Dyer [phone]) on 12/26/2014 2:18:47 PM      MDM   Final diagnoses:  Other fatigue  Palpitations  Tachycardia  Dyspnea on exertion    81 year old who presents with fatigue, palpitations, difficulty breathing. She is nontoxic and in no acute distress on presentation. It is noted to be mildly tachycardic in triage to 102, but on the cardiac monitor has been 80 to 90s with evidence of normal sinus rhythm on EKG. No ischemic changes are noted. She is  not fluid overloaded on exam, and cardiopulmonary exam is otherwise unremarkable. She currently denies any dyspnea or chest pain, but does note fatigue and feeling jittery. Given her age and some risk factors for heart disease, ACS evaluation was performed revealing negative troponin 1 and nonischemic EKG. A chest x-ray is visualized and shows no acute cardiopulmonary processes. She is no evidence of infection on x-ray, and her UA is currently pending at this time. She is noted to be minimally hypokalemic with a potassium of 3.4 and hypomagnesemic with a magnesium of 1.6. No other major electrolyte or metabolic derangements are noted on blood work. She is discussed with the hospitalist, who will met this patient for the remainder of her cardiac workup and possible ECHO given the vague nature of her symptoms and suspicion for possible underlying heart disease. Given prior pulmonary study suggestive of restrictive lung disease this may be progression of this.  Forde Dandy, MD 12/26/14 [phone]

## 2014-12-26 NOTE — Consult Note (Signed)
Patient ID: Kim Colon MRN: [id number] DOB/AGE: 1932-08-13 81 y.o.  Admit date: 12/26/2014 Primary Cardiologist: New Reason for Consultation: Chest pain  HPI: 81 yo female with history of HLD, HTN, GERD, Barrett's esophagitis who is admitted with palpitations. She also notes left sided chest pain associated with SOB and fatigue for several days. She has felt palpitations that are mild and her heart monitor has shown HR 120's. She does not feel irregularity of her heart rhythm. She has no chest pain currently. She has been seen by Dr. Patty Sermons in the past and has had a normal stress test 10 years ago.     Past Medical History  Diagnosis Date  . Osteoporosis with fracture     T12,T11, T10  . Hip fracture     left  . H/O vertebroplasty     T8  . Shingles   . Colon polyps   . Hyperlipemia   . HTN (hypertension)   . Vitamin D deficiency disease   . GERD (gastroesophageal reflux disease)   . Barrett esophagus   . Pulmonary nodule 2006  . Chronic epigastric pain   . Kyphosis     Family History  Problem Relation Age of Onset  . Heart failure Father   . Cancer Father     prostate  . Heart attack Mother   . Parkinsonism Brother   . Stroke Sister   . Heart attack Sister   . Diabetes type II Sister   . Osteopenia Daughter   . Osteopenia Daughter     History   Social History  . Marital Status: Married    Spouse Name: N/A  . Number of Children: 3  . Years of Education: N/A   Occupational History  .      farming/housewife   Social History Main Topics  . Smoking status: Never Smoker   . Smokeless tobacco: Never Used  . Alcohol Use: No  . Drug Use: No  . Sexual Activity: Not on file   Other Topics Concern  . Not on file   Social History Narrative    Past Surgical History  Procedure Laterality Date  . Orif hip fracture  10/2009  . Cholecystectomy    . Appendectomy    . Total abdominal hysterectomy    . Bilateral salpingoophorectomy    . Bunionectomy   08/2010    Right foot  . Toe surgery      Allergies  Allergen Reactions  . Amlodipine     Ankle swelling  . Biaxin [Clarithromycin] Nausea Only  . Duloxetine Other (See Comments)    Dizziness, like a "zombie"    Prior to Admission medications   Medication Sig Start Date End Date Taking? Authorizing Provider  acetaminophen (TYLENOL) 325 MG tablet Take 325 mg by mouth 3 (three) times daily.   Yes Historical Provider, MD  Calcium Carbonate-Vitamin D (CALCIUM-D) 600-400 MG-UNIT TABS Take 1 capsule by mouth daily at 12 noon.    Yes Historical Provider, MD  Cholecalciferol (VITAMIN D3) 1000 UNITS CAPS Take 1 capsule by mouth daily at 12 noon.    Yes Historical Provider, MD  fish oil-omega-3 fatty acids 1000 MG capsule Take 1 g by mouth daily at 12 noon.    Yes Historical Provider, MD  hydrochlorothiazide (HYDRODIURIL) 25 MG tablet Take 25 mg by mouth every morning.    Yes Historical Provider, MD  lisinopril (PRINIVIL,ZESTRIL) 10 MG tablet Take 10 mg by mouth 2 (two) times daily.    Yes  Historical Provider, MD  Multiple Vitamins-Minerals (COMPLETE MULTIVITAMIN/MINERAL PO) Take 1 capsule by mouth daily at 12 noon.    Yes Historical Provider, MD  omeprazole (PRILOSEC) 40 MG capsule Take 40 mg by mouth 2 (two) times daily.    Yes Historical Provider, MD  pyridOXINE (VITAMIN B-6) 100 MG tablet Take 100 mg by mouth daily at 12 noon.    Yes Historical Provider, MD  vitamin B-12 (CYANOCOBALAMIN) 500 MCG tablet Take 500 mcg by mouth daily at 12 noon.    Yes Historical Provider, MD  alum & mag hydroxide-simeth (MAALOX/MYLANTA) 200-200-20 MG/5ML suspension Take 5 mLs by mouth at bedtime as needed for indigestion or heartburn.     Historical Provider, MD  calcium carbonate (TUMS - DOSED IN MG ELEMENTAL CALCIUM) 500 MG chewable tablet Chew 1 tablet by mouth daily as needed for heartburn.    Historical Provider, MD  diclofenac sodium (VOLTAREN) 1 % GEL Apply 1 application topically daily as needed (pain).      Historical Provider, MD  HYDROcodone-acetaminophen (NORCO/VICODIN) 5-325 MG per tablet Take 0.5 tablets by mouth every 4 (four) hours as needed for moderate pain. 10/06/14   Gilda Crease, MD  NASAL SALINE NA Place 1 spray into the nose daily as needed (dry nose).    Historical Provider, MD  Vitamin D, Ergocalciferol, (DRISDOL) 50000 UNITS CAPS Take 50,000 Units by mouth every 30 (thirty) days.    Historical Provider, MD    Review of systems complete and found to be negative unless listed above    Physical Exam: Blood pressure 126/72, pulse 83, temperature 98.2 F (36.8 C), temperature source Oral, resp. rate 21, SpO2 96 %.    General: Well developed, well nourished, NAD  HEENT: OP clear, mucus membranes moist  SKIN: warm, dry. No rashes.  Neuro: No focal deficits  Musculoskeletal: Muscle strength 5/5 all ext  Psychiatric: Mood and affect normal  Neck: No JVD, no carotid bruits, no thyromegaly, no lymphadenopathy.  Lungs:Clear bilaterally, no wheezes, rhonci, crackles  Cardiovascular: Regular rate and rhythm. No murmurs, gallops or rubs.  Abdomen:Soft. Bowel sounds present. Non-tender.  Extremities: No lower extremity edema. Pulses are 2 + in the bilateral DP/PT.  Labs:   Lab Results  Component Value Date   WBC 5.7 12/26/2014   HGB 13.3 12/26/2014   HCT 37.6 12/26/2014   MCV 90.6 12/26/2014   PLT 235 12/26/2014    Recent Labs Lab 12/26/14 1430  NA 137  K 3.4*  CL 101  CO2 26  BUN 18  CREATININE 0.97  CALCIUM 9.9  PROT 6.5  BILITOT 0.6  ALKPHOS 59  ALT 23  AST 32  GLUCOSE 97   Troponin negative x 1.   Chest x-ray 12/26/14: Right base subsegmental atelectasis. No segmental consolidation. Central pulmonary vascular prominence without pulmonary edema. Thoracic kyphosis with several thoracic compression deformities similar to most recent exam. Prior cement augmentation mid thoracic vertebra. Tortuous aorta. Hiatal hernia.  EKG: Sinus tach, rate 106  bpm. Non-specific ST and T wave abnormality  ASSESSMENT AND PLAN:   1. Chest pain: Cannot exclude angina. She has risk factors for CAD including HTN, HLD, advanced age. She is not a diabetic and has never been a smoker. There is a family history of CAD. EKG shows non-specific abnormalities.  -Agree with cycling troponin -Echo in am.  -If cardiac markers are negative, would plan stress myoview in the am  2. Hypokalemia: Replace potassium  3. Palpitations: Will follow on telemetry overnight. She may need  an outpatient monitor.    Signed: Earney Hamburg, MD 12/26/2014, 4:52 PM

## 2014-12-26 NOTE — H&P (Signed)
[facility] History and Physical  Kim Colon WUJ:[id number] DOB: 1932-08-26 DOA: 12/26/2014  Referring physician:  PCP: Petra Kuba, MD  Specialists:   Chief Complaint: chest pressure, palpitations   HPI: Kim Colon is a 81 y.o. female with PMH of HTN, HPL, GERD, Severe kyphosis presented with L sided intermittent chest pressure, associated with palpitations, shortness of breath with exertion, fatigue, tiredness for few days. Patient denies acute chest pain, no cough, no fever, no nausea, vomiting no abdominal pains. But reports chronic GERD  Review of Systems: The patient denies anorexia, fever, weight loss,, vision loss, decreased hearing, hoarseness, peripheral edema, balance deficits, hemoptysis, abdominal pain, melena, hematochezia, severe indigestion/heartburn, hematuria, incontinence, genital sores, muscle weakness, suspicious skin lesions, transient blindness, difficulty walking, depression, unusual weight change, abnormal bleeding, enlarged lymph nodes, angioedema, and breast masses.    Past Medical History  Diagnosis Date  . Osteoporosis with fracture     T12,T11, T10  . Hip fracture     left  . H/O vertebroplasty     T8  . Shingles   . Colon polyps   . Hyperlipemia   . HTN (hypertension)   . Vitamin D deficiency disease   . GERD (gastroesophageal reflux disease)   . Barrett esophagus   . Pulmonary nodule 2006  . Chronic epigastric pain   . Kyphosis    Past Surgical History  Procedure Laterality Date  . Orif hip fracture  10/2009  . Cholecystectomy    . Appendectomy    . Total abdominal hysterectomy    . Bilateral salpingoophorectomy    . Bunionectomy  08/2010    Right foot  . Toe surgery     Social History:  reports that she has never smoked. She has never used smokeless tobacco. She reports that she does not drink alcohol or use illicit drugs. Home;  where does patient live--home, ALF, SNF? and with whom if at home? Yes;  Can patient participate  in ADLs?  Allergies  Allergen Reactions  . Amlodipine     Ankle swelling  . Biaxin [Clarithromycin] Nausea Only    Family History  Problem Relation Age of Onset  . Heart failure Father   . Cancer Father     prostate  . Heart attack Mother   . Parkinsonism Brother   . Stroke Sister   . Heart attack Sister   . Diabetes type II Sister   . Osteopenia Daughter   . Osteopenia Daughter     (be sure to complete)  Prior to Admission medications   Medication Sig Start Date End Date Taking? Authorizing Provider  acetaminophen (TYLENOL) 500 MG tablet Take 500 mg by mouth every 6 (six) hours as needed for pain.     Historical Provider, MD  alum & mag hydroxide-simeth (MAALOX/MYLANTA) 200-200-20 MG/5ML suspension Take 5 mLs by mouth every 6 (six) hours as needed for indigestion.    Historical Provider, MD  calcium carbonate (TUMS - DOSED IN MG ELEMENTAL CALCIUM) 500 MG chewable tablet Chew 1 tablet by mouth daily as needed for heartburn.    Historical Provider, MD  Calcium Carbonate-Vitamin D (CALCIUM-D) 600-400 MG-UNIT TABS Take 1 capsule by mouth daily.     Historical Provider, MD  Cholecalciferol (VITAMIN D3) 1000 UNITS CAPS Take 1 capsule by mouth daily.     Historical Provider, MD  diclofenac sodium (VOLTAREN) 1 % GEL Apply 1 application topically daily as needed (pain).     Historical Provider, MD  DULoxetine (CYMBALTA) 30 MG capsule Take  30 mg by mouth every evening. 12/20/14   Historical Provider, MD  fish oil-omega-3 fatty acids 1000 MG capsule Take 1 g by mouth every morning.    Historical Provider, MD  hydrochlorothiazide (HYDRODIURIL) 25 MG tablet Take 25 mg by mouth every morning.     Historical Provider, MD  HYDROcodone-acetaminophen (NORCO/VICODIN) 5-325 MG per tablet Take 0.5 tablets by mouth every 4 (four) hours as needed for moderate pain. 10/06/14   Gilda Crease, MD  lisinopril (PRINIVIL,ZESTRIL) 10 MG tablet Take 10 mg by mouth 2 (two) times daily.     Historical  Provider, MD  Multiple Vitamins-Minerals (COMPLETE MULTIVITAMIN/MINERAL PO) Take 1 capsule by mouth every morning.     Historical Provider, MD  NASAL SALINE NA Place 1 spray into the nose daily as needed (dry nose).    Historical Provider, MD  omeprazole (PRILOSEC) 40 MG capsule Take 40 mg by mouth daily.     Historical Provider, MD  pyridOXINE (VITAMIN B-6) 100 MG tablet Take 100 mg by mouth every morning.     Historical Provider, MD  vitamin B-12 (CYANOCOBALAMIN) 500 MCG tablet Take 500 mcg by mouth every morning.     Historical Provider, MD  Vitamin D, Ergocalciferol, (DRISDOL) 50000 UNITS CAPS Take 50,000 Units by mouth every 30 (thirty) days.    Historical Provider, MD   Physical Exam: Filed Vitals:   12/26/14 1545  BP: 126/72  Pulse: 83  Temp: 98.2 F (36.8 C)  Resp:      General:  alert  Eyes: eom-i, perrla   ENT: no oral ulcers   Neck: supple, no JVD  Cardiovascular: s1,s2 rrr  Respiratory: CTA BL  Abdomen: soft, mild distended. nt   Skin: no rash   Musculoskeletal: kyphosis. no leg edema   Psychiatric: no hallucinations   Neurologic: CN 2-112 intact; motor 5/5 BL symmetric   Labs on Admission:  Basic Metabolic Panel:  Recent Labs Lab 12/26/14 1430  NA 137  K 3.4*  CL 101  CO2 26  GLUCOSE 97  BUN 18  CREATININE 0.97  CALCIUM 9.9  MG 1.6*  PHOS 3.9   Liver Function Tests:  Recent Labs Lab 12/26/14 1430  AST 32  ALT 23  ALKPHOS 59  BILITOT 0.6  PROT 6.5  ALBUMIN 4.0   No results for input(s): LIPASE, AMYLASE in the last 168 hours. No results for input(s): AMMONIA in the last 168 hours. CBC:  Recent Labs Lab 12/26/14 1430  WBC 5.7  NEUTROABS 3.4  HGB 13.3  HCT 37.6  MCV 90.6  PLT 235   Cardiac Enzymes: No results for input(s): CKTOTAL, CKMB, CKMBINDEX, TROPONINI in the last 168 hours.  BNP (last 3 results)  Recent Labs  12/26/14 1430  BNP 52.6    ProBNP (last 3 results) No results for input(s): PROBNP in the last  8760 hours.  CBG: No results for input(s): GLUCAP in the last 168 hours.  Radiological Exams on Admission: Dg Chest 2 View  12/26/2014   CLINICAL DATA:  81 year old female with chest tightness for several days. Shortness breath. Subsequent encounter.  EXAM: CHEST  2 VIEW  COMPARISON:  11/30/2014 chest x-ray.  FINDINGS: Elevation right hemidiaphragm unchanged with right base subsegmental atelectasis. No segmental consolidation.  Central pulmonary vascular prominence without pulmonary edema.  No pneumothorax.  Thoracic kyphosis with several thoracic compression deformities similar to most recent exam. Prior cement augmentation mid thoracic vertebra.  Tortuous aorta.  Hiatal hernia.  IMPRESSION: Right base subsegmental atelectasis.  No segmental  consolidation.  Central pulmonary vascular prominence without pulmonary edema.  Thoracic kyphosis with several thoracic compression deformities similar to most recent exam. Prior cement augmentation mid thoracic vertebra.  Tortuous aorta.  Hiatal hernia.   Electronically Signed   By: Lacy Duverney M.D.   On: 12/26/2014 15:12    EKG: Independently reviewed.   Assessment/Plan Active Problems:   Fatigue   Chest pain   81 y.o. female with PMH of HTN, HPL, GERD, Severe kyphosis presented with L sided intermittent chest pressure  1. Chest pressure. Atypical for CAD. ? underlying GERD. Initial trop/ECG  -we will obtain serial trop/ECG. Obtain echo. Start ASA. Consulted cardiology  For evaluation for stress test  2. GERD. Cont PPI 3. Hypokalemia, hypo Mg likely due to hctz. Replace. Recheck in AM  4. DOE. We will obtain echo. But symptoms probable underlying restrictive lung disease due to kyphosis    Cardiology.  if consultant consulted, please document name and whether formally or informally consulted  Code Status: full (must indicate code status--if unknown or must be presumed, indicate so) Family Communication: d/w patient, her husband, family   (indicate person spoken with, if applicable, with phone number if by telephone) Disposition Plan: home 24-48 hrs  (indicate anticipated LOS)  Time spent: >45 minutes   Kim Colon N [facility] contact [phone] for 12/26/14 from 7 AM to 7 PM  If 7PM-7AM, please contact night-coverage [email or web address] Password [email or web address] 12/26/2014, 4:25 PM

## 2014-12-26 NOTE — ED Notes (Signed)
Pt sts some palpitations and chest pressure with SOB x 3 days

## 2014-12-27 ENCOUNTER — Other Ambulatory Visit (HOSPITAL_COMMUNITY): Payer: Medicare Other

## 2014-12-27 DIAGNOSIS — R0789 Other chest pain: Secondary | ICD-10-CM | POA: Diagnosis not present

## 2014-12-27 DIAGNOSIS — R0609 Other forms of dyspnea: Secondary | ICD-10-CM

## 2014-12-27 DIAGNOSIS — R5383 Other fatigue: Secondary | ICD-10-CM | POA: Diagnosis not present

## 2014-12-27 DIAGNOSIS — R002 Palpitations: Secondary | ICD-10-CM | POA: Diagnosis not present

## 2014-12-27 DIAGNOSIS — R072 Precordial pain: Secondary | ICD-10-CM | POA: Diagnosis not present

## 2014-12-27 LAB — TROPONIN I

## 2014-12-27 NOTE — Progress Notes (Signed)
[facility] PROGRESS NOTE  Kim Colon ZOX:[id number] DOB: 05/04/1933 DOA: 12/26/2014 PCP: Kim Clan, MD  Assessment/Plan: 1. Chest Pain 1. Atypical for CAD 2. Discussed case with Cardiology, recs to f/u 2d echo and if normal then d/c 2. GERD 1. Stable 2. On PPI 3. Hypokalemia 1. Replaced 4. DOE 1. 2d echo pending, per above 5. DVT prophylaxis 1. Lovenox subQ  Code Status: Full Family Communication: Pt in room (indicate person spoken with, relationship, and if by phone, the number) Disposition Plan: Pending   Consultants:  Cardiology  Procedures:    Antibiotics:   (indicate start date, and stop date if known)  HPI/Subjective: Eager to go home  Objective: Filed Vitals:   12/26/14 2056 12/26/14 2234 12/27/14 0439 12/27/14 1507  BP: 160/97 124/55 121/68 101/51  Pulse: 96 78 70 80  Temp: 97.6 F (36.4 C)  98.1 F (36.7 C) 98.5 F (36.9 C)  TempSrc: Axillary  Oral Oral  Resp: 18  18 18   Height: 5' (1.524 m)     Weight: 50.894 kg (112 lb 3.2 oz)     SpO2: 99% 98% 95% 96%    Intake/Output Summary (Last 24 hours) at 12/27/14 1808 Last data filed at 12/27/14 1302  Gross per 24 hour  Intake    690 ml  Output      0 ml  Net    690 ml   Filed Weights   12/26/14 2056  Weight: 50.894 kg (112 lb 3.2 oz)    Exam:   General:  Awake, in nad  Cardiovascular: regular, s1, s2  Respiratory: normal resp effort, no wheezing  Abdomen: soft,nondistended  Musculoskeletal: perfused, no clubbing   Data Reviewed: Basic Metabolic Panel:  Recent Labs Lab 12/26/14 1430  NA 137  Colon 3.4*  CL 101  CO2 26  GLUCOSE 97  BUN 18  CREATININE 0.97  CALCIUM 9.9  MG 1.6*  PHOS 3.9   Liver Function Tests:  Recent Labs Lab 12/26/14 1430  AST 32  ALT 23  ALKPHOS 59  BILITOT 0.6  PROT 6.5  ALBUMIN 4.0   No results for input(s): LIPASE, AMYLASE in the last 168 hours. No results for input(s): AMMONIA in the last 168 hours. CBC:  Recent Labs Lab  12/26/14 1430  WBC 5.7  NEUTROABS 3.4  HGB 13.3  HCT 37.6  MCV 90.6  PLT 235   Cardiac Enzymes:  Recent Labs Lab 12/26/14 1956 12/27/14 0123 12/27/14 0825  TROPONINI <0.03 <0.03 <0.03   BNP (last 3 results)  Recent Labs  12/26/14 1430  BNP 52.6    ProBNP (last 3 results) No results for input(s): PROBNP in the last 8760 hours.  CBG: No results for input(s): GLUCAP in the last 168 hours.  No results found for this or any previous visit (from the past 240 hour(s)).   Studies: Dg Chest 2 View  12/26/2014   CLINICAL DATA:  81 year old female with chest tightness for several days. Shortness breath. Subsequent encounter.  EXAM: CHEST  2 VIEW  COMPARISON:  11/30/2014 chest x-ray.  FINDINGS: Elevation right hemidiaphragm unchanged with right base subsegmental atelectasis. No segmental consolidation.  Central pulmonary vascular prominence without pulmonary edema.  No pneumothorax.  Thoracic kyphosis with several thoracic compression deformities similar to most recent exam. Prior cement augmentation mid thoracic vertebra.  Tortuous aorta.  Hiatal hernia.  IMPRESSION: Right base subsegmental atelectasis.  No segmental consolidation.  Central pulmonary vascular prominence without pulmonary edema.  Thoracic kyphosis with several thoracic compression deformities similar  to most recent exam. Prior cement augmentation mid thoracic vertebra.  Tortuous aorta.  Hiatal hernia.   Electronically Signed   By: Kim Colon M.D.   On: 12/26/2014 15:12    Scheduled Meds: . aspirin EC  81 mg Oral Daily  . calcium-vitamin D  1 tablet Oral Q1200  . cyanocobalamin  500 mcg Oral Q1200  . enoxaparin (LOVENOX) injection  40 mg Subcutaneous Q24H  . lisinopril  10 mg Oral BID  . magnesium oxide  400 mg Oral BID  . omega-3 acid ethyl esters  1 g Oral Q1200  . pantoprazole  40 mg Oral Daily  . pyridOXINE  100 mg Oral Q1200  . sodium chloride  3 mL Intravenous Q12H   Continuous Infusions:   Active  Problems:   Fatigue   Chest pain    Kim Colon  [facility] Pager [phone]. If 7PM-7AM, please contact night-coverage at [email or web address], password [email or web address] 12/27/2014, 6:08 PM

## 2014-12-27 NOTE — Progress Notes (Signed)
     SUBJECTIVE: No chest pain this am. No SOB. NO events.   BP 121/68 mmHg  Pulse 70  Temp(Src) 98.1 F (36.7 C) (Oral)  Resp 18  Ht 5' (1.524 m)  Wt 112 lb 3.2 oz (50.894 kg)  BMI 21.91 kg/m2  SpO2 95%  Intake/Output Summary (Last 24 hours) at 12/27/14 0926 Last data filed at 12/27/14 1610  Gross per 24 hour  Intake    450 ml  Output      0 ml  Net    450 ml    PHYSICAL EXAM General: Well developed, well nourished, in no acute distress. Alert and oriented x 3.  Psych:  Good affect, responds appropriately Neck: No JVD. No masses noted.  Lungs: Clear bilaterally with no wheezes or rhonci noted.  Heart: RRR with no murmurs noted. Abdomen: Bowel sounds are present. Soft, non-tender.  Extremities: No lower extremity edema.   LABS: Basic Metabolic Panel:  Recent Labs  96/04/54 1430  NA 137  K 3.4*  CL 101  CO2 26  GLUCOSE 97  BUN 18  CREATININE 0.97  CALCIUM 9.9  MG 1.6*  PHOS 3.9   CBC:  Recent Labs  12/26/14 1430  WBC 5.7  NEUTROABS 3.4  HGB 13.3  HCT 37.6  MCV 90.6  PLT 235   Cardiac Enzymes:  Recent Labs  12/26/14 1956 12/27/14 0123 12/27/14 0825  TROPONINI <0.03 <0.03 <0.03    Current Meds: . aspirin EC  81 mg Oral Daily  . calcium-vitamin D  1 tablet Oral Q1200  . cyanocobalamin  500 mcg Oral Q1200  . enoxaparin (LOVENOX) injection  40 mg Subcutaneous Q24H  . lisinopril  10 mg Oral BID  . magnesium oxide  400 mg Oral BID  . omega-3 acid ethyl esters  1 g Oral Q1200  . pantoprazole  40 mg Oral Daily  . pyridOXINE  100 mg Oral Q1200  . sodium chloride  3 mL Intravenous Q12H     ASSESSMENT AND PLAN: 81 yo female with history of HLD, HTN, GERD, Barrett's esophagitis who is admitted with palpitations. She also noted left sided chest pain associated with SOB and fatigue for several days. She has felt palpitations that are mild and her heart monitor has shown HR 120's. She does not feel irregularity of her heart rhythm. She has no chest  pain currently. She has been seen by Dr. Patty Sermons in the past and has had a normal stress test 10 years ago.   1. Chest pain: Troponin negative. Atypical chest pain. She is not a diabetic and has never been a smoker. There is a family history of CAD. EKG shows non-specific abnormalities. Echo today to assess LVEF. If LVEF is normal can d/c home. I can see her in follow up and if necessary can arrange an outpatient stress test.   2. Palpitations: No arrhythmias on tele overnight. I will arrange an outpatient monitor.      Kim Colon  8/3/20169:26 AM

## 2014-12-28 ENCOUNTER — Observation Stay (HOSPITAL_BASED_OUTPATIENT_CLINIC_OR_DEPARTMENT_OTHER): Payer: Medicare Other

## 2014-12-28 DIAGNOSIS — R079 Chest pain, unspecified: Secondary | ICD-10-CM

## 2014-12-28 DIAGNOSIS — R002 Palpitations: Secondary | ICD-10-CM | POA: Diagnosis not present

## 2014-12-28 DIAGNOSIS — R0609 Other forms of dyspnea: Secondary | ICD-10-CM | POA: Diagnosis not present

## 2014-12-28 DIAGNOSIS — R5383 Other fatigue: Secondary | ICD-10-CM | POA: Diagnosis not present

## 2014-12-28 DIAGNOSIS — R0789 Other chest pain: Secondary | ICD-10-CM

## 2014-12-28 DIAGNOSIS — R072 Precordial pain: Secondary | ICD-10-CM | POA: Diagnosis not present

## 2014-12-28 MED ORDER — HYDROCODONE-ACETAMINOPHEN 5-325 MG PO TABS: 0.5000 | ORAL_TABLET | ORAL | Status: DC | PRN

## 2014-12-28 NOTE — Discharge Summary (Signed)
Physician Discharge Summary  Colon Colon ZOX:[id number] DOB: 14-Oct-1932 DOA: 12/26/2014  PCP: Martha Clan, MD  Admit date: 12/26/2014 Discharge date: 12/28/2014  Time spent: 20 minutes  Recommendations for Outpatient Follow-up:  1. Follow up with PCP in 1-2 weeks 2. Follow up with Dr. Patty Sermons in 1-2 weeks  Discharge Diagnoses:  Active Problems:   Fatigue   Chest pain   Dyspnea on exertion   Other fatigue   Palpitations   Discharge Condition: Stable  Diet recommendation: Heart healthy  Filed Weights   12/26/14 2056  Weight: 50.894 kg (112 lb 3.2 oz)   History of present illness:  Please see admit h and p from 8/2 for details. Briefly, pt presented with chest pressure and palpitations. The patient was admitted for further work up.  Hospital Course:  1. Chest Pain 1. Atypical for CAD 2. Discussed case with Cardiology, recs to f/u 2d echo and if normal then d/c 3. If patient has continued symptoms, recommendations for outpatient stress test at that time 2. GERD 1. Remained stable 2. On PPI 3. Hypokalemia 1. Replaced 4. DOE 1. 2d echo pending, per above 5. DVT prophylaxis 1. Lovenox subQ  Procedures:  2d echo  Consultations:  Cardiology  Discharge Exam: Filed Vitals:   12/27/14 1507 12/27/14 2029 12/28/14 0457 12/28/14 1428  BP: 101/51 138/74 98/65 112/60  Pulse: 80 102 74 76  Temp: 98.5 F (36.9 C) 98.2 F (36.8 C) 97.8 F (36.6 C)   TempSrc: Oral Oral Oral   Resp: 18 18 18 18   Height:      Weight:      SpO2: 96% 96% 97% 97%    General: awake, in nad Cardiovascular: regular, s1, s2 Respiratory: normal resp effort, no wheezing  Discharge Instructions     Medication List    TAKE these medications        acetaminophen 325 MG tablet  Commonly known as:  TYLENOL  Take 325 mg by mouth 3 (three) times daily.     alum & mag hydroxide-simeth 200-200-20 MG/5ML suspension  Commonly known as:  MAALOX/MYLANTA  Take 5 mLs by mouth at bedtime as  needed for indigestion or heartburn.     calcium carbonate 500 MG chewable tablet  Commonly known as:  TUMS - dosed in mg elemental calcium  Chew 1 tablet by mouth daily as needed for heartburn.     Calcium-D 600-400 MG-UNIT Tabs  Take 1 capsule by mouth daily at 12 noon.     COMPLETE MULTIVITAMIN/MINERAL PO  Take 1 capsule by mouth daily at 12 noon.     diclofenac sodium 1 % Gel  Commonly known as:  VOLTAREN  Apply 1 application topically at bedtime as needed (pain).     fish oil-omega-3 fatty acids 1000 MG capsule  Take 1 g by mouth daily at 12 noon.     hydrochlorothiazide 25 MG tablet  Commonly known as:  HYDRODIURIL  Take 25 mg by mouth every morning.     HYDROcodone-acetaminophen 5-325 MG per tablet  Commonly known as:  NORCO/VICODIN  Take 0.5 tablets by mouth every 4 (four) hours as needed for moderate pain.     lisinopril 10 MG tablet  Commonly known as:  PRINIVIL,ZESTRIL  Take 10 mg by mouth 2 (two) times daily.     NASAL SALINE NA  Place 1 spray into the nose daily as needed (dry nose).     omeprazole 40 MG capsule  Commonly known as:  PRILOSEC  Take 40 mg  by mouth 2 (two) times daily.     pyridOXINE 100 MG tablet  Commonly known as:  VITAMIN B-6  Take 100 mg by mouth daily at 12 noon.     REFRESH OP  Place 1 drop into both eyes daily as needed (dry eyes).     vitamin B-12 500 MCG tablet  Commonly known as:  CYANOCOBALAMIN  Take 500 mcg by mouth daily at 12 noon.     Vitamin D (Ergocalciferol) 50000 UNITS Caps capsule  Commonly known as:  DRISDOL  Take 50,000 Units by mouth every 30 (thirty) days.     Vitamin D3 1000 UNITS Caps  Take 1 capsule by mouth daily at 12 noon.       Allergies  Allergen Reactions  . Amlodipine     Ankle swelling  . Biaxin [Clarithromycin] Nausea Only  . Duloxetine Other (See Comments)    Dizziness, like a "zombie"   Follow-up Information    Follow up with Martha Clan, MD. Schedule an appointment as soon as  possible for a visit in 1 week.   Specialty:  Internal Medicine   Why:  Hospital follow up   Contact information:   [address] [place] [address] [phone]       Follow up with Ronny Flurry, MD.   Specialty:  Cardiology   Why:  you will be contacted to arrange follow up visit in 1-2 weeks. If you have not heard from the clinic in that time, please call to schedule an appointment.   Contact information:   [address] [place] [address] [phone]        The results of significant diagnostics from this hospitalization (including imaging, microbiology, ancillary and laboratory) are listed below for reference.    Significant Diagnostic Studies: Dg Chest 2 View  12/26/2014   CLINICAL DATA:  81 year old female with chest tightness for several days. Shortness breath. Subsequent encounter.  EXAM: CHEST  2 VIEW  COMPARISON:  11/30/2014 chest x-ray.  FINDINGS: Elevation right hemidiaphragm unchanged with right base subsegmental atelectasis. No segmental consolidation.  Central pulmonary vascular prominence without pulmonary edema.  No pneumothorax.  Thoracic kyphosis with several thoracic compression deformities similar to most recent exam. Prior cement augmentation mid thoracic vertebra.  Tortuous aorta.  Hiatal hernia.  IMPRESSION: Right base subsegmental atelectasis.  No segmental consolidation.  Central pulmonary vascular prominence without pulmonary edema.  Thoracic kyphosis with several thoracic compression deformities similar to most recent exam. Prior cement augmentation mid thoracic vertebra.  Tortuous aorta.  Hiatal hernia.   Electronically Signed   By: Lacy Duverney M.D.   On: 12/26/2014 15:12   Dg Chest 2 View  11/30/2014   CLINICAL DATA:  Cough and shortness of breath  EXAM: CHEST  2 VIEW  COMPARISON:  Chest x-ray dated November 17, 2011  FINDINGS: There is severe chronic thoracic kyphosis secondary to multiple compression fractures. This distorts the  thoracic cavity. The lungs are reasonably well inflated and clear. There is no pleural effusion. The cardiac silhouette is mildly enlarged. The pulmonary vascularity is normal. The mediastinum is normal in width. There are calcified mediastinal lymph nodes. There is a moderate-sized hiatal hernia.  IMPRESSION: There is in no acute pneumonia nor other acute cardiopulmonary abnormality. There is a moderate-sized hiatal hernia which does place the patient at increased risk of aspiration.   Electronically Signed   By: David  [place] M.D.   On: 11/30/2014 16:39    Microbiology: No results found for  this or any previous visit (from the past 240 hour(s)).   Labs: Basic Metabolic Panel:  Recent Labs Lab 12/26/14 1430  NA 137  K 3.4*  CL 101  CO2 26  GLUCOSE 97  BUN 18  CREATININE 0.97  CALCIUM 9.9  MG 1.6*  PHOS 3.9   Liver Function Tests:  Recent Labs Lab 12/26/14 1430  AST 32  ALT 23  ALKPHOS 59  BILITOT 0.6  PROT 6.5  ALBUMIN 4.0   No results for input(s): LIPASE, AMYLASE in the last 168 hours. No results for input(s): AMMONIA in the last 168 hours. CBC:  Recent Labs Lab 12/26/14 1430  WBC 5.7  NEUTROABS 3.4  HGB 13.3  HCT 37.6  MCV 90.6  PLT 235   Cardiac Enzymes:  Recent Labs Lab 12/26/14 1956 12/27/14 0123 12/27/14 0825  TROPONINI <0.03 <0.03 <0.03   BNP: BNP (last 3 results)  Recent Labs  12/26/14 1430  BNP 52.6    ProBNP (last 3 results) No results for input(s): PROBNP in the last 8760 hours.  CBG: No results for input(s): GLUCAP in the last 168 hours.   Signed:  Arien Morine, Scheryl Marten  [facility] 12/28/2014, 6:26 PM

## 2014-12-28 NOTE — Progress Notes (Signed)
     SUBJECTIVE: No chest pain this am. No SOB  BP 98/65 mmHg  Pulse 74  Temp(Src) 97.8 F (36.6 C) (Oral)  Resp 18  Ht 5' (1.524 m)  Wt 112 lb 3.2 oz (50.894 kg)  BMI 21.91 kg/m2  SpO2 97%  Intake/Output Summary (Last 24 hours) at 12/28/14 0931 Last data filed at 12/27/14 1858  Gross per 24 hour  Intake    340 ml  Output      0 ml  Net    340 ml    PHYSICAL EXAM General: Well developed, well nourished, in no acute distress. Alert and oriented x 3.  Psych:  Good affect, responds appropriately Neck: No JVD. No masses noted.  Lungs: Clear bilaterally with no wheezes or rhonci noted.  Heart: RRR with no murmurs noted. Abdomen: Bowel sounds are present. Soft, non-tender.  Extremities: No lower extremity edema.   LABS: Basic Metabolic Panel:  Recent Labs  16/10/96 1430  NA 137  K 3.4*  CL 101  CO2 26  GLUCOSE 97  BUN 18  CREATININE 0.97  CALCIUM 9.9  MG 1.6*  PHOS 3.9   CBC:  Recent Labs  12/26/14 1430  WBC 5.7  NEUTROABS 3.4  HGB 13.3  HCT 37.6  MCV 90.6  PLT 235   Cardiac Enzymes:  Recent Labs  12/26/14 1956 12/27/14 0123 12/27/14 0825  TROPONINI <0.03 <0.03 <0.03    Current Meds: . aspirin EC  81 mg Oral Daily  . calcium-vitamin D  1 tablet Oral Q1200  . cyanocobalamin  500 mcg Oral Q1200  . enoxaparin (LOVENOX) injection  40 mg Subcutaneous Q24H  . lisinopril  10 mg Oral BID  . magnesium oxide  400 mg Oral BID  . omega-3 acid ethyl esters  1 g Oral Q1200  . pantoprazole  40 mg Oral Daily  . pyridOXINE  100 mg Oral Q1200  . sodium chloride  3 mL Intravenous Q12H     ASSESSMENT AND PLAN: 81 yo female with history of HLD, HTN, GERD, Barrett's esophagitis who is admitted with palpitations. She also noted left sided chest pain associated with SOB and fatigue for several days. She has felt palpitations that are mild and her heart monitor has shown HR 120's. She does not feel irregularity of her heart rhythm. She has no chest pain  currently. She has been seen by Dr. Patty Sermons in the past and has had a normal stress test 10 years ago.  1. Chest pain: Troponin negative. Atypical chest pain. She is not a diabetic and has never been a smoker. There is a family history of CAD. EKG shows non-specific abnormalities. Echo today to assess LVEF. If LVEF is normal can d/c home. We will arrange f/u with Dr. Patty Sermons in the office in 1-2 weeks. I will ask our office to have her come next week to arrange 48 hour monitor If she has recurrent chest pain, can arrange an outpatient stress test.   2. Palpitations: No arrhythmias on tele overnight. I will arrange an outpatient monitor as she believes her heart rate increases at home with exertion and she is concerned about this.      Kim Colon  8/4/20169:31 AM

## 2014-12-28 NOTE — Progress Notes (Signed)
UR Completed. Boots Mcglown, RN, BSN.  [phone] 

## 2014-12-28 NOTE — Progress Notes (Signed)
  Echocardiogram 2D Echocardiogram has been performed.  Tye Savoy 12/28/2014, 10:10 AM

## 2014-12-28 NOTE — Progress Notes (Signed)
S/L and monitor removed.  D/C instructions given to patient and discussed.  The patient verbalized understanding.

## 2015-01-03 ENCOUNTER — Telehealth: Payer: Self-pay | Admitting: Physician Assistant

## 2015-01-03 NOTE — Telephone Encounter (Signed)
Patient contacted regarding discharge from [facility] on December 28, 2014.  Patient understands to follow up with provider Ronie Spies, PA-C on January 11, 2015 at 10AM at [facility]. Patient understands discharge instructions? yes Patient understands medications and regiment? yes Patient understands to bring all medications to this visit? yes

## 2015-01-03 NOTE — Telephone Encounter (Signed)
Left message to call back  

## 2015-01-03 NOTE — Telephone Encounter (Signed)
New message      Hosp f/u appt made on 01-11-15 with Ronie Spies.  Dr Alver Fisher office at [facility] medical called stating that according to the pt's discharge summary, she is to be seen in 1-2 weeks by Dr Patty Sermons.  Not sure if this is a TCM appt.

## 2015-01-05 ENCOUNTER — Other Ambulatory Visit: Payer: Self-pay | Admitting: *Deleted

## 2015-01-05 DIAGNOSIS — R002 Palpitations: Secondary | ICD-10-CM

## 2015-01-08 ENCOUNTER — Ambulatory Visit (INDEPENDENT_AMBULATORY_CARE_PROVIDER_SITE_OTHER): Payer: Medicare Other

## 2015-01-08 DIAGNOSIS — R002 Palpitations: Secondary | ICD-10-CM

## 2015-01-10 NOTE — Progress Notes (Addendum)
Cardiology Office Note Date:  01/11/2015  Patient ID:  Kim Colon, DOB Sep 28, 1932, MRN [id number] PCP:  Martha Clan, MD  Cardiologist:  Dr. Patty Sermons remotely  Chief Complaint: f/u hospitalization for CP  History of Present Illness: Kim Colon is a 81 y.o. female with history of HTN, HLD, GERD, Barrett's esophagus, significant kyphosis who presents for post-hospital follow-up. She was recently admitted for left sided CP associated with SOB and fatigue for several days. She also had reported a HR monitor showing HR 120s but had not felt any irregular HR. Initial EKG showed sinus tach 106 with nonspecific abnormalities. She r/o for MI and TSH was normal. CMET was normal except hypokalemia, hypomagnesemia. 2D Echo 12/28/14: EF 55-60%, no RWMA, normal diastolic parameters. It was felt since echo was normal, would reserve stress testing for recurrent CP. 48-hour Holter was set up showing NSR and low grade sinus tach 100-113, occasionally with sinus tach correlating with symptoms (but also with some episodes of NSR correlating with symptoms).  She continues to have occasional chest pains. She feels like it is somewhat worse ever since they pressed on her chest with the echo doppler probe. It comes and goes at random. It is worse with slight movements or sometimes when she lays down in bed. She has chronic dyspnea but says she was told in the past this was due to restriction from her kyphosis. She does not participate in a lot of activity. No LEE, erythema, orthopnea, bleeding or syncope.  Past Medical History  Diagnosis Date  . Osteoporosis with fracture     T12,T11, T10  . Hip fracture     left  . Shingles   . Colon polyps   . Hyperlipemia   . HTN (hypertension)   . Vitamin D deficiency disease   . GERD (gastroesophageal reflux disease)   . Barrett esophagus   . Pulmonary nodule 2006  . Chronic epigastric pain   . Kyphosis   . History of hiatal hernia   . Restrictive lung disease       "because of the way my back is"  . Sleep apnea     "went thru study; found evidence of sleep apnea; never got a mask" (12/26/2014)  . Daily headache   . Arthritis   . Chronic back pain   . Depression     Past Surgical History  Procedure Laterality Date  . Orif hip fracture Left 10/2009  . Appendectomy    . Bunionectomy Right 08/2010  . Toe surgery Right 08/2010    "took some bones out of the 2 toes next to my big toe"  . Tonsillectomy    . Cholecystectomy open    . Cataract extraction w/ intraocular lens  implant, bilateral Bilateral   . Fracture surgery    . Total abdominal hysterectomy      w/BSO  . Tubal ligation  1973  . Dilation and curettage of uterus  1060's    "might have done one when I miscarried"  . Vertebroplasty      T8  . Esophagogastroduodenoscopy (egd) with esophageal dilation  X 2?    Current Outpatient Prescriptions  Medication Sig Dispense Refill  . acetaminophen (TYLENOL) 325 MG tablet Take 325 mg by mouth 3 (three) times daily.    Marland Kitchen alum & mag hydroxide-simeth (MAALOX/MYLANTA) 200-200-20 MG/5ML suspension Take 5 mLs by mouth at bedtime as needed for indigestion or heartburn.     . calcium carbonate (TUMS - DOSED IN MG ELEMENTAL CALCIUM)  500 MG chewable tablet Chew 1 tablet by mouth daily as needed for heartburn.    . Calcium Carbonate-Vitamin D (CALCIUM-D) 600-400 MG-UNIT TABS Take 1 capsule by mouth daily at 12 noon.     . Cholecalciferol (VITAMIN D3) 1000 UNITS CAPS Take 1 capsule by mouth daily at 12 noon.     . diclofenac sodium (VOLTAREN) 1 % GEL Apply 1 application topically at bedtime as needed (pain).     . fish oil-omega-3 fatty acids 1000 MG capsule Take 1 g by mouth daily at 12 noon.     . hydrochlorothiazide (HYDRODIURIL) 25 MG tablet Take 25 mg by mouth every morning.     Marland Kitchen HYDROcodone-acetaminophen (NORCO/VICODIN) 5-325 MG per tablet Take 0.5 tablets by mouth every 4 (four) hours as needed for moderate pain. 10 tablet 0  . lisinopril  (PRINIVIL,ZESTRIL) 10 MG tablet Take 10 mg by mouth 2 (two) times daily.     . Multiple Vitamins-Minerals (COMPLETE MULTIVITAMIN/MINERAL PO) Take 1 capsule by mouth daily at 12 noon.     Marland Kitchen NASAL SALINE NA Place 1 spray into the nose daily as needed (dry nose).    Marland Kitchen omeprazole (PRILOSEC) 40 MG capsule Take 40 mg by mouth 2 (two) times daily.     . Polyvinyl Alcohol-Povidone (REFRESH OP) Place 1 drop into both eyes daily as needed (dry eyes).    . pyridOXINE (VITAMIN B-6) 100 MG tablet Take 100 mg by mouth daily at 12 noon.     . vitamin B-12 (CYANOCOBALAMIN) 500 MCG tablet Take 500 mcg by mouth daily at 12 noon.     . Vitamin D, Ergocalciferol, (DRISDOL) 50000 UNITS CAPS Take 50,000 Units by mouth every 30 (thirty) days.     No current facility-administered medications for this visit.    Allergies:   Duloxetine; Amlodipine; and Biaxin   Social History:  The patient  reports that she has never smoked. She has never used smokeless tobacco. She reports that she does not drink alcohol or use illicit drugs.   Family History:  The patient's family history includes Cancer in her father; Diabetes type II in her sister; Heart attack in her brother and mother; Heart failure in her father; Osteopenia in her daughter and daughter; Parkinsonism in her brother; Stroke in her brother.  ROS:  Please see the history of present illness.    All other systems are reviewed and otherwise negative.   PHYSICAL EXAM:  VS:  BP 118/78 mmHg  Pulse 82  Ht 4\' 11"  (1.499 m)  Wt 115 lb 12.8 oz (52.527 kg)  BMI 23.38 kg/m2 BMI: Body mass index is 23.38 kg/(m^2). Well nourished thin WF in no acute distress. Significant kyphosis. HEENT: normocephalic, atraumatic Neck: no JVD, carotid bruits or masses Cardiac:  normal S1, S2; RRR; no murmurs, rubs, or gallops Lungs:  clear to auscultation bilaterally, no wheezing, rhonchi or rales Abd: soft, nontender, no hepatomegaly, + BS MS: no deformity or atrophy Ext: no edema, LE  erythema or palpable cords Skin: warm and dry, no rash Neuro:  moves all extremities spontaneously, no focal abnormalities noted, follows commands Psych: euthymic mood, full affect  EKG:  Done today shows NSR 82bpm right axis deviation, no acute changes  Recent Labs: 12/26/2014: ALT 23; B Natriuretic Peptide 52.6; BUN 18; Creatinine, Ser 0.97; Hemoglobin 13.3; Magnesium 1.6*; Platelets 235; Potassium 3.4*; Sodium 137; TSH 1.223  No results found for requested labs within last 365 days.   Estimated Creatinine Clearance: 34.2 mL/min (by C-G formula based  on Cr of 0.97).   Wt Readings from Last 3 Encounters:  01/11/15 115 lb 12.8 oz (52.527 kg)  12/26/14 112 lb 3.2 oz (50.894 kg)  09/19/14 119 lb 12.8 oz (54.341 kg)     Other studies reviewed: Additional studies/records reviewed today include: summarized above  ASSESSMENT AND PLAN:  1. Precordial chest pain - grossly atypical, sounds musculoskeletal in etiology - ?precordial catch syndrome. No evidence of DVT on exam. O2 sat is normal. However given her cardiac risk factors & the fact that we cannot fully assess her functional status given limited activity, will proceed with Lexiscan nuclear stress testing. If this is normal would consider trial of NSAIDs and f/u with PCP. 2. Palpitations - likely due to intermittent sinus tach per event monitor. TSH normal. Will try her on metoprolol. I do not think her BP would allow for just adding this medicine to current regimen so I stopped HCTZ to make room for this. 3. Essential HTN - follow BP with above changes. She keeps a daily log. 4. Hypokalemia/hypomagnesemia - likely r/t to her HCTZ but will recheck today.   Disposition: F/u with Dr. Patty Sermons in 3 months.  Current medicines are reviewed at length with the patient today.  The patient did not have any concerns regarding medicines.  Thomasene Mohair PA-C 01/11/2015 10:15 AM     [facility] [address] [place] [address] [phone] (office)  [phone] (fax)

## 2015-01-11 ENCOUNTER — Encounter: Payer: Self-pay | Admitting: Physician Assistant

## 2015-01-11 ENCOUNTER — Ambulatory Visit (INDEPENDENT_AMBULATORY_CARE_PROVIDER_SITE_OTHER): Payer: Medicare Other | Admitting: Physician Assistant

## 2015-01-11 VITALS — BP 118/78 | HR 82 | Ht 59.0 in | Wt 115.8 lb

## 2015-01-11 DIAGNOSIS — I10 Essential (primary) hypertension: Secondary | ICD-10-CM | POA: Diagnosis not present

## 2015-01-11 DIAGNOSIS — E876 Hypokalemia: Secondary | ICD-10-CM

## 2015-01-11 DIAGNOSIS — R002 Palpitations: Secondary | ICD-10-CM

## 2015-01-11 DIAGNOSIS — R072 Precordial pain: Secondary | ICD-10-CM

## 2015-01-11 LAB — BASIC METABOLIC PANEL
BUN: 19 mg/dL (ref 6–23)
CHLORIDE: 103 meq/L (ref 96–112)
CO2: 27 mEq/L (ref 19–32)
Calcium: 9.9 mg/dL (ref 8.4–10.5)
Creatinine, Ser: 0.94 mg/dL (ref 0.40–1.20)
GFR: 60.82 mL/min (ref >60.00)
GLUCOSE: 97 mg/dL (ref 70–99)
POTASSIUM: 3.7 meq/L (ref 3.5–5.1)
Sodium: 138 mEq/L (ref 135–145)

## 2015-01-11 MED ORDER — METOPROLOL SUCCINATE ER 25 MG PO TB24: 25.0000 mg | ORAL_TABLET | Freq: Every day | ORAL | Status: DC

## 2015-01-11 NOTE — Patient Instructions (Signed)
Medication Instructions:  Your physician has recommended you make the following change in your medication:  STOP HCTZ  START Metoprolol  daily. An Rx has been sent to your pharmacy   Labwork: Bmet, Mag Today  Testing/Procedures: Your physician has requested that you have a lexiscan myoview. For further information please visit [email or web address] Please follow instruction sheet, as given.    Follow-Up: Your physician recommends that you schedule a follow-up appointment in: 3 months with Dr.Brackbill   Any Other Special Instructions Will Be Listed Below (If Applicable).

## 2015-01-12 ENCOUNTER — Other Ambulatory Visit (INDEPENDENT_AMBULATORY_CARE_PROVIDER_SITE_OTHER): Payer: Medicare Other

## 2015-01-12 ENCOUNTER — Telehealth: Payer: Self-pay

## 2015-01-12 DIAGNOSIS — R790 Abnormal level of blood mineral: Secondary | ICD-10-CM

## 2015-01-12 LAB — MAGNESIUM: Magnesium: 1.6 mg/dL (ref 1.5–2.5)

## 2015-01-12 NOTE — Telephone Encounter (Signed)
Patient called back about lab results. Per Ronie Spies PA, Magnesium is borderline low but may have been due to the hydrochlorothiazide (which we stopped). Please repeat Magnesium level when she comes in for her stress test. In the meantime, increase intake of foods rich in magnesium such as leafy greens, nuts, seeds, fish, beans, whole grains, avocados, yogurt, bananas, dried fruit. Patient verbalized understanding and will have Magnesium level done on 01/22/2015 after stress test.

## 2015-01-17 ENCOUNTER — Telehealth (HOSPITAL_COMMUNITY): Payer: Self-pay

## 2015-01-17 NOTE — Telephone Encounter (Signed)
Patient given detailed instructions per Myocardial Perfusion Study Information Sheet for test on 01-22-2015 at 0915. Patient Notified to arrive 15 minutes early, and that it is imperative to arrive on time for appointment to keep from having the test rescheduled. Patient verbalized understanding. Randa Evens, Worthington Cruzan A

## 2015-01-22 ENCOUNTER — Other Ambulatory Visit (INDEPENDENT_AMBULATORY_CARE_PROVIDER_SITE_OTHER): Payer: Medicare Other | Admitting: *Deleted

## 2015-01-22 ENCOUNTER — Ambulatory Visit (HOSPITAL_COMMUNITY): Payer: Medicare Other | Attending: Physician Assistant

## 2015-01-22 VITALS — Ht 59.0 in | Wt 115.0 lb

## 2015-01-22 DIAGNOSIS — R072 Precordial pain: Secondary | ICD-10-CM | POA: Diagnosis present

## 2015-01-22 DIAGNOSIS — R790 Abnormal level of blood mineral: Secondary | ICD-10-CM

## 2015-01-22 DIAGNOSIS — R42 Dizziness and giddiness: Secondary | ICD-10-CM | POA: Diagnosis not present

## 2015-01-22 DIAGNOSIS — R0602 Shortness of breath: Secondary | ICD-10-CM | POA: Diagnosis not present

## 2015-01-22 DIAGNOSIS — I10 Essential (primary) hypertension: Secondary | ICD-10-CM | POA: Insufficient documentation

## 2015-01-22 DIAGNOSIS — R0609 Other forms of dyspnea: Secondary | ICD-10-CM | POA: Diagnosis not present

## 2015-01-22 DIAGNOSIS — R002 Palpitations: Secondary | ICD-10-CM | POA: Diagnosis not present

## 2015-01-22 LAB — MYOCARDIAL PERFUSION IMAGING
CHL CUP RESTING HR STRESS: 65 {beats}/min
CSEPPHR: 106 {beats}/min
LHR: 0.37
LV dias vol: 36 mL
LVSYSVOL: 3 mL
NUC STRESS TID: 0.92
SDS: 12
SRS: 2
SSS: 14

## 2015-01-22 LAB — MAGNESIUM: Magnesium: 1.8 mg/dL (ref 1.5–2.5)

## 2015-01-22 MED ORDER — TECHNETIUM TC 99M SESTAMIBI GENERIC - CARDIOLITE
32.7000 | Freq: Once | INTRAVENOUS | Status: AC | PRN
  Administered 2015-01-22: 32.7 via INTRAVENOUS

## 2015-01-22 MED ORDER — REGADENOSON 0.4 MG/5ML IV SOLN
0.4000 mg | Freq: Once | INTRAVENOUS | Status: AC
  Administered 2015-01-22: 0.4 mg via INTRAVENOUS

## 2015-01-22 MED ORDER — TECHNETIUM TC 99M SESTAMIBI GENERIC - CARDIOLITE
10.8000 | Freq: Once | INTRAVENOUS | Status: AC | PRN
  Administered 2015-01-22: 11 via INTRAVENOUS

## 2015-01-22 MED ORDER — AMINOPHYLLINE 25 MG/ML IV SOLN
75.0000 mg | Freq: Two times a day (BID) | INTRAVENOUS | Status: DC | PRN
  Administered 2015-01-22: 75 mg via INTRAVENOUS

## 2015-01-24 ENCOUNTER — Other Ambulatory Visit: Payer: Self-pay | Admitting: Gastroenterology

## 2015-01-24 DIAGNOSIS — R1310 Dysphagia, unspecified: Secondary | ICD-10-CM

## 2015-01-26 ENCOUNTER — Ambulatory Visit
Admission: RE | Admit: 2015-01-26 | Discharge: 2015-01-26 | Disposition: A | Discharge status: Home / Self Care | Payer: Medicare Other | Source: Ambulatory Visit | Attending: Gastroenterology | Admitting: Gastroenterology

## 2015-01-26 DIAGNOSIS — R1310 Dysphagia, unspecified: Secondary | ICD-10-CM

## 2015-01-30 ENCOUNTER — Telehealth: Payer: Self-pay | Admitting: Cardiology

## 2015-01-30 DIAGNOSIS — Z79899 Other long term (current) drug therapy: Secondary | ICD-10-CM

## 2015-01-30 MED ORDER — HYDROCHLOROTHIAZIDE 25 MG PO TABS: 25.0000 mg | ORAL_TABLET | Freq: Every day | ORAL | Status: DC

## 2015-01-30 MED ORDER — POTASSIUM CHLORIDE ER 10 MEQ PO TBCR: 10.0000 meq | EXTENDED_RELEASE_TABLET | Freq: Every day | ORAL | Status: DC

## 2015-01-30 NOTE — Telephone Encounter (Signed)
Spoke with patient and she has had increased blood pressure since changing her HCTZ to Metoprolol  Blood pressures prior to making change were in 110's-120's Discussed with Dr Elease Hashimoto DOD and will add back the HCTZ 25 mg daily and start K+10 meq daily, bmet and magnesium in 2-3 weeks Scheduled follow up labs and advised patient, verbalized understanding

## 2015-01-30 NOTE — Telephone Encounter (Signed)
New message    Pt c/o BP issue: STAT if pt c/o blurred vision, one-sided weakness or slurred speech  1. What are your last 5 BP readings? Sunday 167/93 pulse 72; Monday 2am 164/88; 10pm 156/88; Tuesday 8:30am 158/96   2. Are you having any other symptoms (ex. Dizziness, headache, blurred vision, passed out)? Dizzy at times; headache  3. What is your BP issue? B/p running higher than normal

## 2015-02-06 ENCOUNTER — Ambulatory Visit (INDEPENDENT_AMBULATORY_CARE_PROVIDER_SITE_OTHER): Payer: Medicare Other | Admitting: Internal Medicine

## 2015-02-06 ENCOUNTER — Encounter: Payer: Self-pay | Admitting: Internal Medicine

## 2015-02-06 VITALS — BP 122/74 | HR 88

## 2015-02-06 DIAGNOSIS — R0609 Other forms of dyspnea: Secondary | ICD-10-CM | POA: Diagnosis not present

## 2015-02-06 DIAGNOSIS — I10 Essential (primary) hypertension: Secondary | ICD-10-CM

## 2015-02-06 MED ORDER — IRBESARTAN 75 MG PO TABS: 75.0000 mg | ORAL_TABLET | Freq: Every day | ORAL | Status: DC

## 2015-02-06 NOTE — Assessment & Plan Note (Signed)
ACE inhibitors are problematic in  pts with airway complaints because  even experienced pulmonologists can't always distinguish ace effects from copd/asthma/pnds/ allergies etc.  By themselves they don't actually cause a problem, much like oxygen can't by itself start a fire, but they certainly serve as a powerful catalyst or enhancer for any "fire"  or inflammatory process in the upper airway, be it caused by an ET  tube or more commonly reflux (especially in the obese or pts with known GERD or who are on biphoshonates) or URI's, due to interference with bradykinin clearance.  The effects of acei on bradykinin levels occurs in 100% of pt's on acei (unless they surreptitiously stop the med!) but the classic cough is only reported in 5%.  This leaves 95% of pts on acei's  with a variety of syndromes including no identifiable symptom in most  vs non-specific symptoms that wax and wane depending on what other insult is occuring at the level of the upper airway, esp GERD as already documented here.  Try avapro 75 mg daily and stop lisinopril for a minimum of 6 weeks. If not better, I would be happy to reevaluate her here

## 2015-02-06 NOTE — Assessment & Plan Note (Signed)
-   PFT's  02/05/11 min restriction no obstr  -02/06/2015  Walked RA  2 laps @ 185 ft each stopped due to  Fatigue, not sob,slow pace no desat  -Trial off acei 02/06/2015 >>>  Symptoms are markedly disproportionate to objective findings and not clear this is a lung problem but pt does appear to have difficult airway management issues. DDX of  difficult airways management all start with A and  include Adherence, Ace Inhibitors, Acid Reflux, Active Sinus Disease, Alpha 1 Antitripsin deficiency, Anxiety masquerading as Airways dz,  ABPA,  allergy(esp in young), Aspiration (esp in elderly), Adverse effects of meds,  Active smokers, A bunch of PE's (a small clot burden can't cause this syndrome unless there is already severe underlying pulm or vascular dz with poor reserve) plus two Bs  = Bronchiectasis and Beta blocker use..and one C= CHF  ACEi at the very top of the list of usual suspects and the only way to know if this is correct his to try off of it for at least 6 weeks - see hbp  ? Acid (or non-acid) GERD > always difficult to exclude as up to 75% of pts in some series report no assoc GI/ Heartburn symptoms and she clearly has HH and overt HB > rec max (24h)  acid suppression and diet restrictions/ reviewed and instructions given in writing.   ? Anxiety related to kyphosis/ restrictive changes dx of exclusion here and not so easily corrected as a trial off acei   Total time = 61m review case with pt/ discussion/ counseling/ giving and going over instructions (see avs)

## 2015-02-06 NOTE — Patient Instructions (Addendum)
prilsoec should be Take 30- 60 min before your first and last meals of the day   GERD (REFLUX)  is an extremely common cause of respiratory symptoms just like yours , many times with no obvious heartburn at all.    It can be treated with medication, but also with lifestyle changes including elevation of the head of your bed (ideally with 6 inch  bed blocks),  Smoking cessation, avoidance of late meals, excessive alcohol, and avoid fatty foods, chocolate, peppermint, colas, red wine, and acidic juices such as orange juice.  NO MINT OR MENTHOL PRODUCTS SO NO COUGH DROPS  USE SUGARLESS CANDY INSTEAD (Jolley ranchers or Stover's or Life Savers) or even ice chips will also do - the key is to swallow to prevent all throat clearing. NO OIL BASED VITAMINS - use powdered substitutes.    Stop lisinopril and start ibesartan 75 mg one tablet daily and your symptoms should gradually improve, especially the cough    If you are satisfied with your treatment plan,  let your doctor know and he/she can either refill your medications or you can return here when your prescription runs out.     If in any way you are not 100% satisfied,  please tell us.  If 100% better, tell your friends!  Pulmonary follow up is as needed

## 2015-02-06 NOTE — Progress Notes (Signed)
Subjective:     Patient ID: Kim Colon, female   DOB: [birth year], 81 y.o.   MRN: [id number]  HPI   17  yowf never smoker with severe kyphosis consult requested for worsening sob/ dry cough      02/06/2015 1st [facility] office visit/ Marina Boerner   Chief Complaint  Patient presents with  . Acute Visit    Pt c/o increased SOB for the past several wks. She states she gets SOB when she bends over and also with minimal exertion.  She also c/o "spells of coughing"- non prod.  doe is chronic eg walking at [facility]  But def worse   x one year, sev months of dry cough day worse than night, croopy on ACEi chronically assoc with severe sob to point where "can't do anything" including bending over at the waist   No obvious day to day or daytime variability or assoc  cp or chest tightness, subjective wheeze or overt sinus or hb symptoms. No unusual exp hx or h/o childhood pna/ asthma or knowledge of premature birth.  Sleeping ok without nocturnal  or early am exacerbation  of respiratory  c/o's or need for noct saba. Also denies any obvious fluctuation of symptoms with weather or environmental changes or other aggravating or alleviating factors except as outlined above   Current Medications, Allergies, Complete Past Medical History, Past Surgical History, Family History, and Social History were reviewed in [employer] record.  ROS  The following are not active complaints unless bolded sore throat, dysphagia, dental problems, itching, sneezing,  nasal congestion or excess/ purulent secretions, ear ache,   fever, chills, sweats, unintended wt loss, classically pleuritic or exertional cp, hemoptysis,  orthopnea pnd or leg swelling, presyncope, palpitations, abdominal pain, anorexia, nausea, vomiting, diarrhea  or change in bowel or bladder habits, change in stools or urine, dysuria,hematuria,  rash, arthralgias, visual complaints, headache, numbness, weakness or ataxia or problems with  walking or coordination,  change in mood/affect or memory.         Review of Systems     Objective:   Physical Exam    amb wf severe kyphosis  Wt Readings from Last 3 Encounters:  01/22/15 115 lb (52.164 kg)  01/11/15 115 lb 12.8 oz (52.527 kg)  12/26/14 112 lb 3.2 oz (50.894 kg)    Vital signs reviewed    HEENT: nl dentition, turbinates, and orophanx. Nl external ear canals without cough reflex   NECK :  without JVD/Nodes/TM/ nl carotid upstrokes bilaterally   LUNGS: no acc muscle use, clear to A and P bilaterally without cough on insp or exp maneuvers   CV:  RRR  no s3 or murmur or increase in P2, no edema   ABD:  soft and nontender with nl excursion in the supine position. No bruits or organomegaly, bowel sounds nl  MS:  warm without deformities, calf tenderness, cyanosis or clubbing  SKIN: warm and dry without lesions    NEURO:  alert, approp, no deficits   Labs 12/26/14  BNP 52    I personally reviewed images and agree with radiology impression as follows:  CXR:   12/26/14 Right base subsegmental atelectasis. No segmental consolidation. Central pulmonary vascular prominence without pulmonary edema. Thoracic kyphosis with several thoracic compression deformities similar to most recent exam. Prior cement augmentation mid thoracic vertebra. Tortuous aorta. Hiatal hernia      DgEs 01/26/15  1. Moderate size hiatal hernia with mild gastroesophageal reflux. Barium pill passes into the  stomach without delay. 2. Mild tertiary contractions. 3. No aspiration or penetration. 4. Slight deviation of the lower cervical esophagus to the left. Possible enlargement of the right lobe of thyroid or thyroid nodule. Correlate clinically. Assessment:       Outpatient Encounter Prescriptions as of 02/06/2015  Medication Sig  . acetaminophen (TYLENOL) 325 MG tablet Take 325 mg by mouth 3 (three) times daily.  . calcium carbonate (TUMS - DOSED IN MG ELEMENTAL CALCIUM)  500 MG chewable tablet Chew 1 tablet by mouth daily as needed for heartburn.  . Calcium Carbonate-Vitamin D (CALCIUM-D) 600-400 MG-UNIT TABS Take 1 capsule by mouth daily at 12 noon.   . Cholecalciferol (VITAMIN D3) 1000 UNITS CAPS Take 1 capsule by mouth daily at 12 noon.   . diclofenac sodium (VOLTAREN) 1 % GEL Apply 1 application topically at bedtime as needed (pain).   . hydrochlorothiazide (HYDRODIURIL) 25 MG tablet Take 1 tablet (25 mg total) by mouth daily.  Marland Kitchen HYDROcodone-acetaminophen (NORCO/VICODIN) 5-325 MG per tablet Take 0.5 tablets by mouth every 4 (four) hours as needed for moderate pain.  . metoprolol succinate (TOPROL XL) 25 MG 24 hr tablet Take 1 tablet (25 mg total) by mouth daily.  . Multiple Vitamins-Minerals (COMPLETE MULTIVITAMIN/MINERAL PO) Take 1 capsule by mouth daily at 12 noon.   Marland Kitchen NASAL SALINE NA Place 1 spray into the nose daily as needed (dry nose).  Marland Kitchen omeprazole (PRILOSEC) 40 MG capsule Take 40 mg by mouth 2 (two) times daily.   . Polyvinyl Alcohol-Povidone (REFRESH OP) Place 1 drop into both eyes daily as needed (dry eyes).  . potassium chloride (K-DUR,KLOR-CON) 10 MEQ tablet Take 10 mEq by mouth 2 (two) times daily.  Marland Kitchen pyridOXINE (VITAMIN B-6) 100 MG tablet Take 100 mg by mouth daily at 12 noon.   . vitamin B-12 (CYANOCOBALAMIN) 500 MCG tablet Take 500 mcg by mouth daily at 12 noon.   . Vitamin D, Ergocalciferol, (DRISDOL) 50000 UNITS CAPS Take 50,000 Units by mouth every 30 (thirty) days.  . [DISCONTINUED] fish oil-omega-3 fatty acids 1000 MG capsule Take 1 g by mouth daily at 12 noon.   . [DISCONTINUED] lisinopril (PRINIVIL,ZESTRIL) 10 MG tablet Take 10 mg by mouth 2 (two) times daily.   . irbesartan (AVAPRO) 75 MG tablet Take 1 tablet (75 mg total) by mouth daily.  . [DISCONTINUED] alum & mag hydroxide-simeth (MAALOX/MYLANTA) 200-200-20 MG/5ML suspension Take 5 mLs by mouth at bedtime as needed for indigestion or heartburn.   . [DISCONTINUED] potassium chloride  (K-DUR) 10 MEQ tablet Take 1 tablet (10 mEq total) by mouth daily.  . [DISCONTINUED] aminophylline injection 75 mg    No facility-administered encounter medications on file as of 02/06/2015.

## 2015-02-13 ENCOUNTER — Telehealth: Payer: Self-pay | Admitting: Cardiology

## 2015-02-13 NOTE — Telephone Encounter (Signed)
New message      Pt is due for labs this thurs.  Dr Sherene Sires stopped lisinopril and started pt on irbesartan.  Should she keep thurs lab appt or reschedule it further out?

## 2015-02-13 NOTE — Telephone Encounter (Signed)
Advised patient to keep labs as scheduled

## 2015-02-15 ENCOUNTER — Other Ambulatory Visit (INDEPENDENT_AMBULATORY_CARE_PROVIDER_SITE_OTHER): Payer: Medicare Other

## 2015-02-15 DIAGNOSIS — Z79899 Other long term (current) drug therapy: Secondary | ICD-10-CM | POA: Diagnosis not present

## 2015-02-15 LAB — BASIC METABOLIC PANEL
BUN: 21 mg/dL (ref 6–23)
CHLORIDE: 99 meq/L (ref 96–112)
CO2: 32 meq/L (ref 19–32)
CREATININE: 0.86 mg/dL (ref 0.40–1.20)
Calcium: 10.1 mg/dL (ref 8.4–10.5)
GFR: 67.38 mL/min (ref >60.00)
Glucose, Bld: 92 mg/dL (ref 70–99)
POTASSIUM: 4.2 meq/L (ref 3.5–5.1)
Sodium: 137 mEq/L (ref 135–145)

## 2015-02-15 LAB — MAGNESIUM: Magnesium: 1.6 mg/dL (ref 1.5–2.5)

## 2015-04-18 ENCOUNTER — Ambulatory Visit (INDEPENDENT_AMBULATORY_CARE_PROVIDER_SITE_OTHER): Payer: Medicare Other | Admitting: Cardiology

## 2015-04-18 ENCOUNTER — Encounter: Payer: Self-pay | Admitting: Cardiology

## 2015-04-18 VITALS — BP 150/72 | HR 84 | Ht <= 58 in | Wt 114.8 lb

## 2015-04-18 DIAGNOSIS — I10 Essential (primary) hypertension: Secondary | ICD-10-CM

## 2015-04-18 DIAGNOSIS — R002 Palpitations: Secondary | ICD-10-CM

## 2015-04-18 DIAGNOSIS — R0609 Other forms of dyspnea: Secondary | ICD-10-CM | POA: Diagnosis not present

## 2015-04-18 NOTE — Progress Notes (Signed)
Cardiology Office Note   Date:  04/18/2015   ID:  Kim Colon, DOB 1932-10-06, MRN [id number]  PCP:  Martha ClanShaw, William, MD  Cardiologist: Cassell Clementhomas Octavio Matheney MD  No chief complaint on file.     History of Present Illness: Kim Colon is a 81 y.o. female who presents for  Follow-up office visit.  Kim Colon is a 81 y.o. female with history of HTN, HLD, GERD, Barrett's esophagus, significant kyphosis who presents for post-hospital follow-up.  She is a medical patient of Dr. Eric Formoug Shaw.   Several months ago she was admitted for left sided CP associated with SOB and fatigue for several days. She also had reported a HR monitor showing HR 120s but had not felt any irregular HR. Initial EKG showed sinus tach 106 with nonspecific abnormalities. She r/o for MI and TSH was normal. CMET was normal except hypokalemia, hypomagnesemia. 2D Echo 12/28/14: EF 55-60%, no RWMA, normal diastolic parameters. It was felt since echo was normal, would reserve stress testing for recurrent CP.  On 01/08/15 she had a 48 hour Holter monitor which was normal. On 01/22/15 she underwent a Myoview stress test which showed an ejection fraction of 92% and was low risk and showed no ischemia. She was empirically placed on metoprolol and has felt better with less palpitations and less forceful heart action.    Past Medical History  Diagnosis Date  . Osteoporosis with fracture     T12,T11, T10  . Hip fracture (HCC)     left  . Shingles   . Colon polyps   . Hyperlipemia   . HTN (hypertension)   . Vitamin D deficiency disease   . GERD (gastroesophageal reflux disease)   . Barrett esophagus   . Pulmonary nodule 2006  . Chronic epigastric pain   . Kyphosis   . History of hiatal hernia   . Restrictive lung disease     "because of the way my back is"  . Sleep apnea     "went thru study; found evidence of sleep apnea; never got a mask" (12/26/2014)  . Daily headache   . Arthritis   . Chronic back pain   .  Depression     Past Surgical History  Procedure Laterality Date  . Orif hip fracture Left 10/2009  . Appendectomy    . Bunionectomy Right 08/2010  . Toe surgery Right 08/2010    "took some bones out of the 2 toes next to my big toe"  . Tonsillectomy    . Cholecystectomy open    . Cataract extraction w/ intraocular lens  implant, bilateral Bilateral   . Fracture surgery    . Total abdominal hysterectomy      w/BSO  . Tubal ligation  1973  . Dilation and curettage of uterus  1060's    "might have done one when I miscarried"  . Vertebroplasty      T8  . Esophagogastroduodenoscopy (egd) with esophageal dilation  X 2?     Current Outpatient Prescriptions  Medication Sig Dispense Refill  . acetaminophen (TYLENOL) 325 MG tablet Take 325 mg by mouth 3 (three) times daily.    . calcium carbonate (TUMS - DOSED IN MG ELEMENTAL CALCIUM) 500 MG chewable tablet Chew 1 tablet by mouth daily as needed for heartburn.    . Calcium Carbonate-Vitamin D (CALCIUM-D) 600-400 MG-UNIT TABS Take 1 capsule by mouth daily at 12 noon.     . Cholecalciferol (VITAMIN D3) 1000 UNITS CAPS Take 1  capsule by mouth daily at 12 noon.     . diclofenac sodium (VOLTAREN) 1 % GEL Apply 1 application topically at bedtime as needed (pain).     . hydrochlorothiazide (HYDRODIURIL) 25 MG tablet Take 1 tablet (25 mg total) by mouth daily. 30 tablet 5  . HYDROcodone-acetaminophen (NORCO/VICODIN) 5-325 MG per tablet Take 0.5 tablets by mouth every 4 (four) hours as needed for moderate pain. 10 tablet 0  . irbesartan (AVAPRO) 75 MG tablet Take 1 tablet (75 mg total) by mouth daily. 30 tablet 11  . metoprolol succinate (TOPROL XL) 25 MG 24 hr tablet Take 1 tablet (25 mg total) by mouth daily. 30 tablet 5  . Multiple Vitamins-Minerals (COMPLETE MULTIVITAMIN/MINERAL PO) Take 1 capsule by mouth daily at 12 noon.     Marland Kitchen NASAL SALINE NA Place 1 spray into the nose daily as needed (dry nose).    Marland Kitchen omeprazole (PRILOSEC) 40 MG capsule Take  40 mg by mouth 2 (two) times daily.     . Polyvinyl Alcohol-Povidone (REFRESH OP) Place 1 drop into both eyes daily as needed (dry eyes).    . potassium chloride (K-DUR,KLOR-CON) 10 MEQ tablet Take 10 mEq by mouth 2 (two) times daily.    Marland Kitchen pyridOXINE (VITAMIN B-6) 100 MG tablet Take 100 mg by mouth daily at 12 noon.     . vitamin B-12 (CYANOCOBALAMIN) 500 MCG tablet Take 500 mcg by mouth daily at 12 noon.     . Vitamin D, Ergocalciferol, (DRISDOL) 50000 UNITS CAPS Take 50,000 Units by mouth every 30 (thirty) days.     No current facility-administered medications for this visit.    Allergies:   Duloxetine; Amlodipine; and Biaxin    Social History:  The patient  reports that she has never smoked. She has never used smokeless tobacco. She reports that she does not drink alcohol or use illicit drugs.   Family History:  The patient's family history includes Cancer in her father; Diabetes type II in her sister; Heart attack in her brother and mother; Heart failure in her father; Osteopenia in her daughter and daughter; Parkinsonism in her brother; Stroke in her brother.    ROS:  Please see the history of present illness.   Otherwise, review of systems are positive for none.   All other systems are reviewed and negative.    PHYSICAL EXAM: VS:  BP 150/72 mmHg  Pulse 84  Ht  (1.448 m)  Wt 114 lb 12.8 oz (52.073 kg)  BMI 24.84 kg/m2  SpO2 98% , BMI Body mass index is 24.84 kg/(m^2). GEN: Well nourished, well developed, in no acute distress.  moderate kyphosis HEENT: normal Neck: no JVD, carotid bruits, or masses Cardiac: RRR; no murmurs, rubs, or gallops,no edema  Respiratory:  clear to auscultation bilaterally, normal work of breathing GI: soft, nontender, nondistended, + BS MS: no deformity or atrophy Skin: warm and dry, no rash Neuro:  Strength and sensation are intact Psych: euthymic mood, full affect   EKG:  EKG is not ordered today.    Recent Labs: 12/26/2014: ALT 23; B  Natriuretic Peptide 52.6; Hemoglobin 13.3; Platelets 235; TSH 1.223 02/15/2015: BUN 21; Creatinine, Ser 0.86; Magnesium 1.6; Potassium 4.2; Sodium 137    Lipid Panel No results found for: CHOL, TRIG, HDL, CHOLHDL, VLDL, LDLCALC, LDLDIRECT    Wt Readings from Last 3 Encounters:  04/18/15 114 lb 12.8 oz (52.073 kg)  01/22/15 115 lb (52.164 kg)  01/11/15 115 lb 12.8 oz (52.527 kg)  ASSESSMENT AND PLAN:  1.  chest pain felt to be noncardiac. Normal Holter monitor.  Normal Lexiscan Myoview stress test. 2. Palpitations -  Improved on low dose metoprolol succinate 25 mg daily 3. Essential HTN - follow BP with above changes. She keeps a daily log. 4.  kyphosis   Current medicines are reviewed at length with the patient today.  The patient does not have concerns regarding medicines.  The following changes have been made:  no change  Labs/ tests ordered today include:  No orders of the defined types were placed in this encounter.     disposition: continue current medications. Continue close follow-up with PCP Dr. Clelia Croft. Return here when necessary  Signed, Cassell Clement MD 04/18/2015 11:27 AM    [facility] [address] [place], [place], [place]  [address] Phone: [phone]; Fax: [phone]

## 2015-04-18 NOTE — Patient Instructions (Signed)
Medication Instructions:  Your physician recommends that you continue on your current medications as directed. Please refer to the Current Medication list given to you today.  Labwork: none  Testing/Procedures: none  Follow-Up: Follow up as needed   If you need a refill on your cardiac medications before your next appointment, please call your pharmacy.  

## 2015-06-04 ENCOUNTER — Other Ambulatory Visit: Payer: Self-pay | Admitting: *Deleted

## 2015-06-04 MED ORDER — IRBESARTAN 75 MG PO TABS: 75.0000 mg | ORAL_TABLET | Freq: Every day | ORAL | Status: DC

## 2015-06-04 NOTE — Telephone Encounter (Signed)
Refilled. Defer to PCP

## 2015-07-03 ENCOUNTER — Other Ambulatory Visit: Payer: Self-pay | Admitting: Physician Assistant

## 2015-07-19 ENCOUNTER — Other Ambulatory Visit: Payer: Self-pay | Admitting: Gastroenterology

## 2015-07-19 DIAGNOSIS — K219 Gastro-esophageal reflux disease without esophagitis: Secondary | ICD-10-CM

## 2015-07-23 ENCOUNTER — Other Ambulatory Visit: Payer: Self-pay | Admitting: Gastroenterology

## 2015-07-23 DIAGNOSIS — R059 Cough, unspecified: Secondary | ICD-10-CM

## 2015-07-23 DIAGNOSIS — IMO0001 Reserved for inherently not codable concepts without codable children: Secondary | ICD-10-CM

## 2015-07-23 DIAGNOSIS — R05 Cough: Secondary | ICD-10-CM

## 2015-07-23 DIAGNOSIS — K219 Gastro-esophageal reflux disease without esophagitis: Secondary | ICD-10-CM

## 2015-07-24 ENCOUNTER — Ambulatory Visit
Admission: RE | Admit: 2015-07-24 | Discharge: 2015-07-24 | Disposition: A | Discharge status: Home / Self Care | Payer: Medicare Other | Source: Ambulatory Visit | Attending: Gastroenterology | Admitting: Gastroenterology

## 2015-07-24 DIAGNOSIS — R05 Cough: Secondary | ICD-10-CM

## 2015-07-24 DIAGNOSIS — K219 Gastro-esophageal reflux disease without esophagitis: Secondary | ICD-10-CM

## 2015-07-24 DIAGNOSIS — IMO0001 Reserved for inherently not codable concepts without codable children: Secondary | ICD-10-CM

## 2015-07-24 DIAGNOSIS — R059 Cough, unspecified: Secondary | ICD-10-CM

## 2015-07-29 ENCOUNTER — Other Ambulatory Visit: Payer: Self-pay | Admitting: Cardiovascular Disease

## 2015-09-03 ENCOUNTER — Other Ambulatory Visit: Payer: Self-pay | Admitting: Cardiovascular Disease

## 2015-09-21 ENCOUNTER — Other Ambulatory Visit: Payer: Self-pay | Admitting: Internal Medicine

## 2015-10-15 ENCOUNTER — Other Ambulatory Visit: Payer: Self-pay

## 2015-10-16 ENCOUNTER — Other Ambulatory Visit: Payer: Self-pay

## 2015-10-16 MED ORDER — METOPROLOL SUCCINATE ER 25 MG PO TB24: ORAL_TABLET | ORAL | Status: DC

## 2015-11-03 ENCOUNTER — Encounter (HOSPITAL_COMMUNITY): Payer: Self-pay | Admitting: Emergency Medicine

## 2015-11-03 ENCOUNTER — Ambulatory Visit (HOSPITAL_COMMUNITY)
Admission: EM | Admit: 2015-11-03 | Discharge: 2015-11-03 | Disposition: A | Discharge status: Home / Self Care | Payer: Medicare Other | Attending: Emergency Medicine | Admitting: Emergency Medicine

## 2015-11-03 DIAGNOSIS — W57XXXA Bitten or stung by nonvenomous insect and other nonvenomous arthropods, initial encounter: Secondary | ICD-10-CM | POA: Diagnosis not present

## 2015-11-03 DIAGNOSIS — T148 Other injury of unspecified body region: Secondary | ICD-10-CM

## 2015-11-03 MED ORDER — HYDROCORTISONE 2.5 % EX LOTN: TOPICAL_LOTION | Freq: Two times a day (BID) | CUTANEOUS | Status: DC

## 2015-11-03 NOTE — ED Notes (Signed)
Pt here for poss tick bite to back of neck onset 6/8 associated w/swelling... Denies pain, fevers... A&O x4... No acute distress.

## 2015-11-03 NOTE — ED Provider Notes (Signed)
CSN: [id number]     Arrival date & time 11/03/15  1843 History   First MD Initiated Contact with Patient 11/03/15 1919     Chief Complaint  Patient presents with  . Tick Removal   (Consider location/radiation/quality/duration/timing/severity/associated sxs/prior Treatment) HPI  She is an 82 year old woman here with her husband for possible tick bite. She states she noticed the bump on her left posterior neck 2 days ago. Today, when she was wiping it with an alcohol, a small speck came off. She was concerned that it was possibly a tic or part of the tick. No fevers. The spot is itchy. She has removed ticks from her husband recently.  Past Medical History  Diagnosis Date  . Osteoporosis with fracture     T12,T11, T10  . Hip fracture (HCC)     left  . Shingles   . Colon polyps   . Hyperlipemia   . HTN (hypertension)   . Vitamin D deficiency disease   . GERD (gastroesophageal reflux disease)   . Barrett esophagus   . Pulmonary nodule 2006  . Chronic epigastric pain   . Kyphosis   . History of hiatal hernia   . Restrictive lung disease     "because of the way my back is"  . Sleep apnea     "went thru study; found evidence of sleep apnea; never got a mask" (12/26/2014)  . Daily headache   . Arthritis   . Chronic back pain   . Depression    Past Surgical History  Procedure Laterality Date  . Orif hip fracture Left 10/2009  . Appendectomy    . Bunionectomy Right 08/2010  . Toe surgery Right 08/2010    "took some bones out of the 2 toes next to my big toe"  . Tonsillectomy    . Cholecystectomy open    . Cataract extraction w/ intraocular lens  implant, bilateral Bilateral   . Fracture surgery    . Total abdominal hysterectomy      w/BSO  . Tubal ligation  1973  . Dilation and curettage of uterus  1060's    "might have done one when I miscarried"  . Vertebroplasty      T8  . Esophagogastroduodenoscopy (egd) with esophageal dilation  X 2?   Family History  Problem Relation  Age of Onset  . Heart failure Father   . Cancer Father     prostate  . Heart attack Mother   . Parkinsonism Brother   . Diabetes type II Sister   . Osteopenia Daughter   . Osteopenia Daughter   . Stroke Brother   . Heart attack Brother    Social History  Substance Use Topics  . Smoking status: Never Smoker   . Smokeless tobacco: Never Used  . Alcohol Use: No   OB History    No data available     Review of Systems As in history of present illness Allergies  Duloxetine; Amlodipine; and Biaxin  Home Medications   Prior to Admission medications   Medication Sig Start Date End Date Taking? Authorizing Provider  hydrochlorothiazide (HYDRODIURIL) 25 MG tablet TAKE 1 TABLET (25 MG TOTAL) BY MOUTH DAILY. 09/04/15  Yes Vesta Mixer, MD  irbesartan (AVAPRO) 75 MG tablet TAKE 1 TABLET BY MOUTH DY 09/21/15  Yes Nyoka Cowden, MD  KLOR-CON M10 10 MEQ tablet TAKE 1 TABLET EVERY DAY 07/31/15  Yes Vesta Mixer, MD  metoprolol succinate (TOPROL-XL) 25 MG 24 hr tablet TAKE 1  TABLET (25 MG TOTAL) BY MOUTH DAILY. 10/16/15  Yes Dayna N Dunn, PA-C  Multiple Vitamins-Minerals (COMPLETE MULTIVITAMIN/MINERAL PO) Take 1 capsule by mouth daily at 12 noon.    Yes Historical Provider, MD  omeprazole (PRILOSEC) 40 MG capsule Take 40 mg by mouth 2 (two) times daily.    Yes Historical Provider, MD  acetaminophen (TYLENOL) 325 MG tablet Take 325 mg by mouth 3 (three) times daily.    Historical Provider, MD  calcium carbonate (TUMS - DOSED IN MG ELEMENTAL CALCIUM) 500 MG chewable tablet Chew 1 tablet by mouth daily as needed for heartburn.    Historical Provider, MD  Calcium Carbonate-Vitamin D (CALCIUM-D) 600-400 MG-UNIT TABS Take 1 capsule by mouth daily at 12 noon.     Historical Provider, MD  Cholecalciferol (VITAMIN D3) 1000 UNITS CAPS Take 1 capsule by mouth daily at 12 noon.     Historical Provider, MD  diclofenac sodium (VOLTAREN) 1 % GEL Apply 1 application topically at bedtime as needed (pain).      Historical Provider, MD  HYDROcodone-acetaminophen (NORCO/VICODIN) 5-325 MG per tablet Take 0.5 tablets by mouth every 4 (four) hours as needed for moderate pain. 12/28/14   Jerald KiefStephen K Chiu, MD  hydrocortisone 2.5 % lotion Apply topically 2 (two) times daily. 11/03/15   Charm RingsErin J Alontae Chaloux, MD  NASAL SALINE NA Place 1 spray into the nose daily as needed (dry nose).    Historical Provider, MD  Polyvinyl Alcohol-Povidone (REFRESH OP) Place 1 drop into both eyes daily as needed (dry eyes).    Historical Provider, MD  potassium chloride (K-DUR,KLOR-CON) 10 MEQ tablet Take 10 mEq by mouth 2 (two) times daily.    Historical Provider, MD  pyridOXINE (VITAMIN B-6) 100 MG tablet Take 100 mg by mouth daily at 12 noon.     Historical Provider, MD  vitamin B-12 (CYANOCOBALAMIN) 500 MCG tablet Take 500 mcg by mouth daily at 12 noon.     Historical Provider, MD  Vitamin D, Ergocalciferol, (DRISDOL) 50000 UNITS CAPS Take 50,000 Units by mouth every 30 (thirty) days.    Historical Provider, MD   Meds Ordered and Administered this Visit  Medications - No data to display  BP 154/89 mmHg  Pulse 90  Temp(Src) 98.4 F (36.9 C) (Oral)  Resp 20  SpO2 97% No data found.   Physical Exam  Constitutional: She is oriented to person, place, and time. She appears well-developed and well-nourished. No distress.  Cardiovascular: Normal rate.   Pulmonary/Chest: Effort normal.  Neurological: She is alert and oriented to person, place, and time.  Skin:  She has what appears to be a bug bite on the left posterior neck right at the hairline. No foreign body or retained tick product under magnification. There is some surrounding erythema, but no signs of infection.    ED Course  Procedures (including critical care time)  Labs Review Labs Reviewed - No data to display  Imaging Review No results found.   MDM   1. Bug bite    Hydrocortisone cream as needed to help with itching. Return precautions reviewed.    Charm RingsErin J  Daylyn Azbill, MD 11/03/15 [phone]

## 2015-11-03 NOTE — ED Notes (Signed)
D/c by dr. honig 

## 2015-11-03 NOTE — Discharge Instructions (Signed)
The bump on your neck is a bug bite. This may have been a tick bite. There is no retained tick. I do not see any sign of infection. Use the hydrocortisone cream twice a day to help with itching and redness. This should go down over the next several days to a week. Follow-up as needed.

## 2015-12-14 ENCOUNTER — Ambulatory Visit: Payer: Medicare Other

## 2015-12-17 ENCOUNTER — Ambulatory Visit: Payer: Medicare Other | Attending: Internal Medicine

## 2015-12-17 DIAGNOSIS — R2689 Other abnormalities of gait and mobility: Secondary | ICD-10-CM | POA: Insufficient documentation

## 2015-12-17 DIAGNOSIS — R42 Dizziness and giddiness: Secondary | ICD-10-CM | POA: Insufficient documentation

## 2015-12-17 DIAGNOSIS — M542 Cervicalgia: Secondary | ICD-10-CM | POA: Insufficient documentation

## 2015-12-17 DIAGNOSIS — R293 Abnormal posture: Secondary | ICD-10-CM | POA: Diagnosis present

## 2015-12-17 DIAGNOSIS — M6281 Muscle weakness (generalized): Secondary | ICD-10-CM | POA: Insufficient documentation

## 2015-12-17 IMAGING — RF DG ESOPHAGUS
11 of 13 series · 19 of 24 positions shown · non-contrast
Comparison: Upper GI of 12/30/2004

CLINICAL DATA: Dysphagia

EXAM:
ESOPHOGRAM/BARIUM SWALLOW
TECHNIQUE: Single contrast examination was performed using  thin barium.
FLUOROSCOPY TIME:  Radiation Exposure Index (as provided by the
fluoroscopic device): 29 Gy per sq cm
If the device does not provide the exposure index:
Fluoroscopy Time:  1 minutes 18 seconds
Number of Acquired Images:

[Series 1: run · 5 of 9 slices shown (1 of 11)]
[im 1/9]
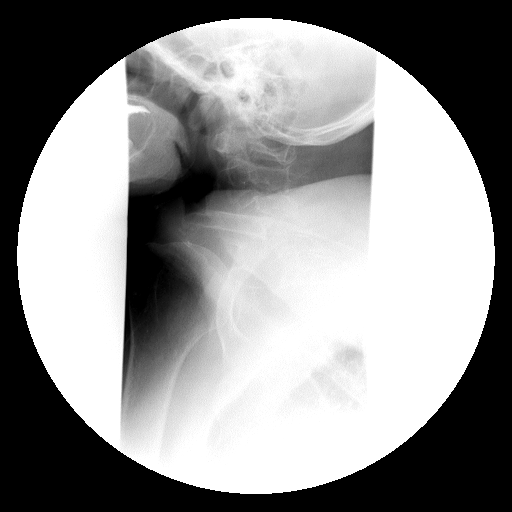
[im 2/9]
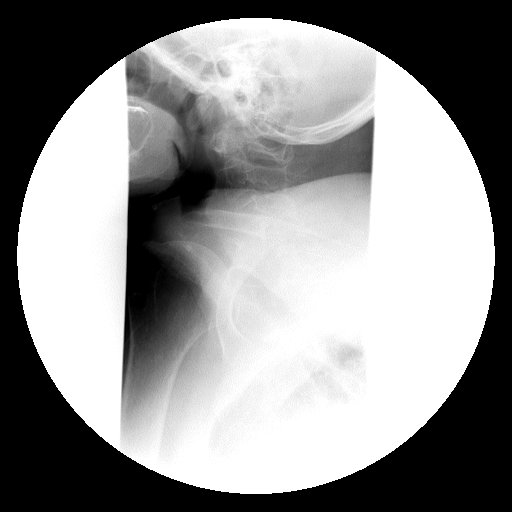
[im 5/9]
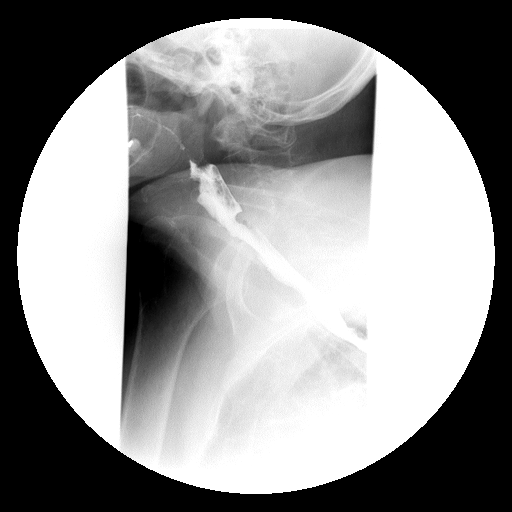
[im 7/9]
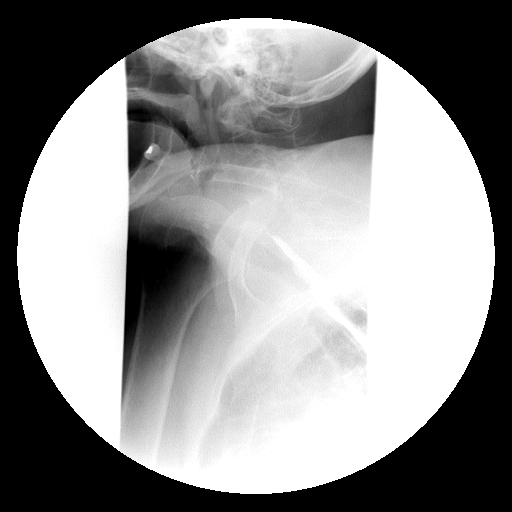
[im 9/9]
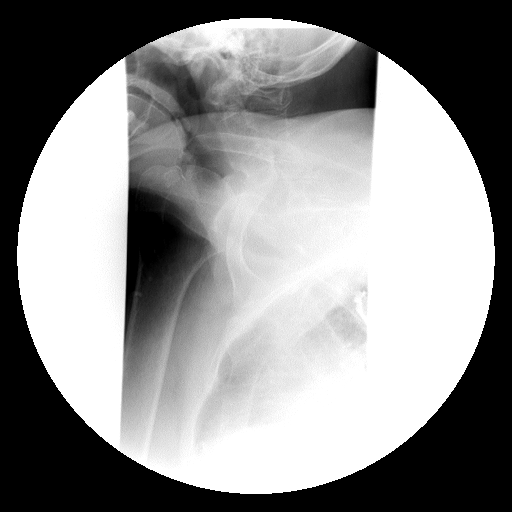

[Series 2: run · 5 of 10 slices shown (2 of 11)]
[im 1/10]
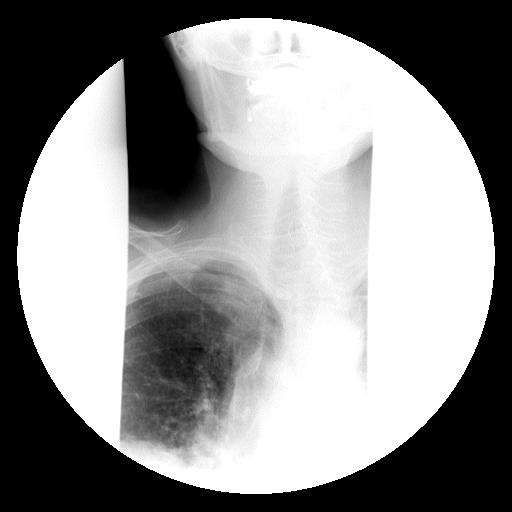
[im 4/10]
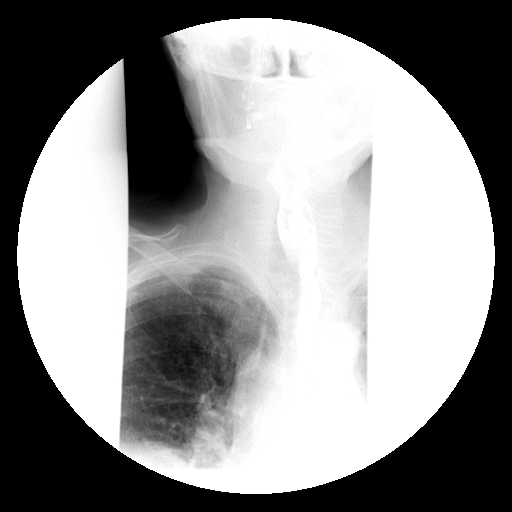
[im 5/10]
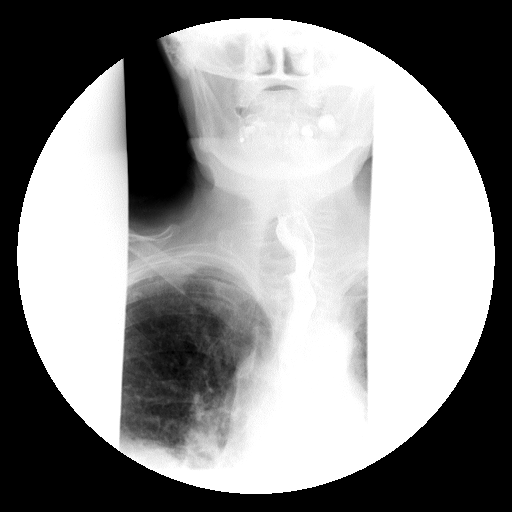
[im 7/10]
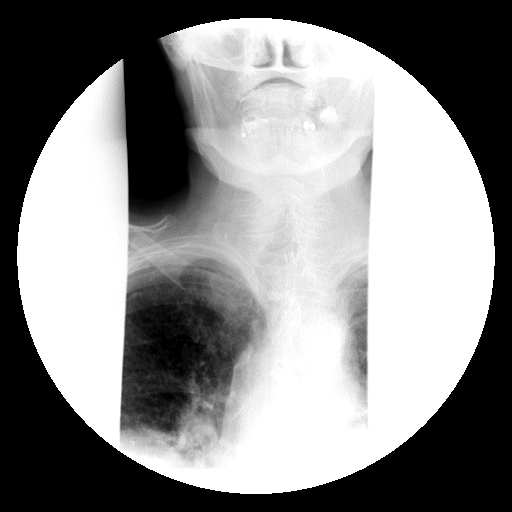
[im 10/10]
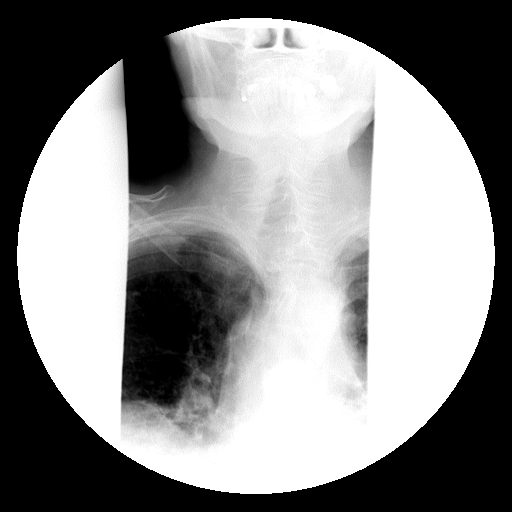

[Series 4: run · 1 of 1 slices shown (3 of 11)]
[im 1/1]
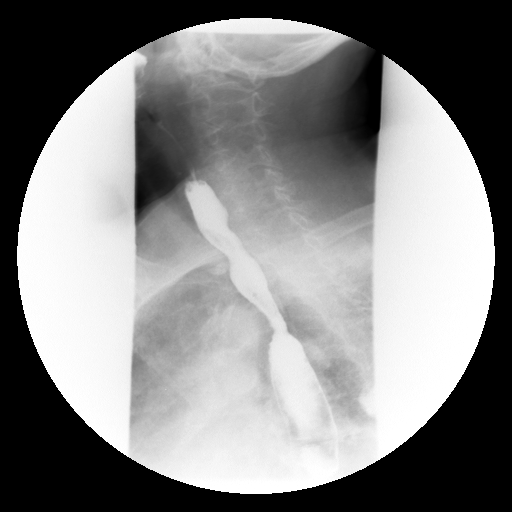

[Series 5: run · 1 of 1 slices shown (4 of 11)]
[im 1/1]
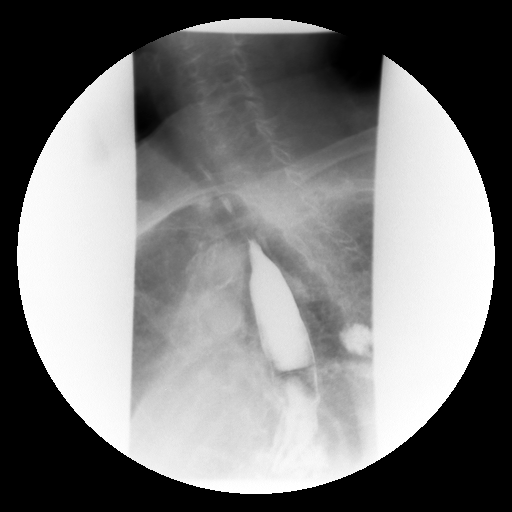

[Series 6: run · 1 of 1 slices shown (5 of 11)]
[im 1/1]
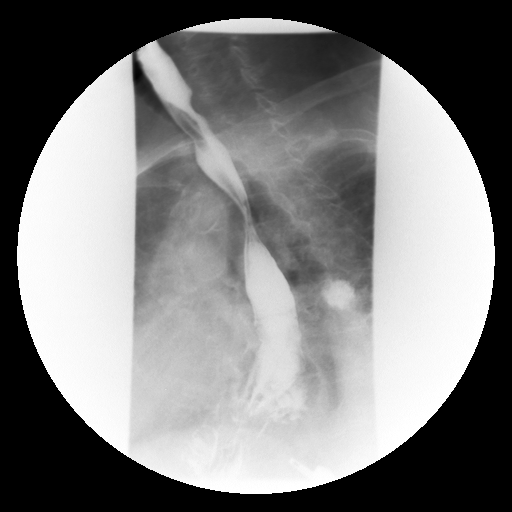

[Series 8: run · 1 of 1 slices shown (6 of 11)]
[im 1/1]
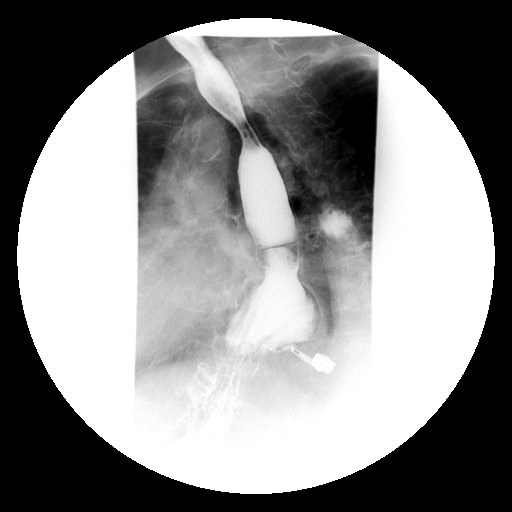

[Series 9: run · 1 of 1 slices shown (7 of 11)]
[im 1/1]
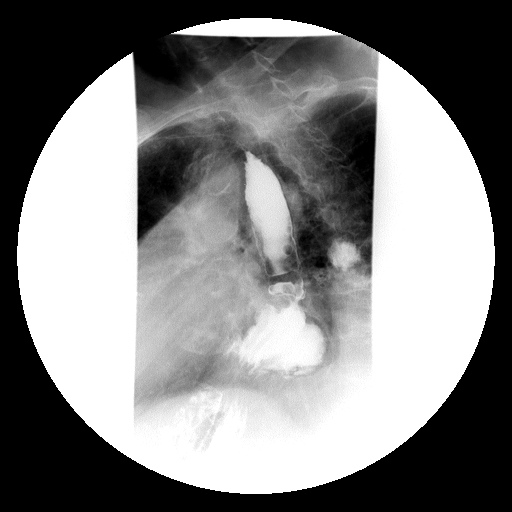

[Series 10: run · 1 of 1 slices shown (8 of 11)]
[im 1/1]
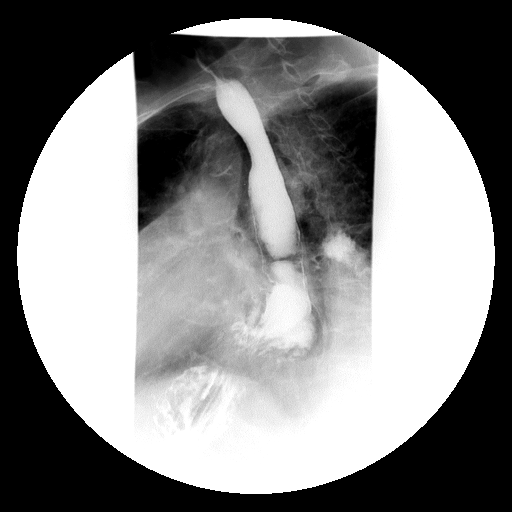

[Series 11: run · 1 of 1 slices shown (9 of 11)]
[im 1/1]
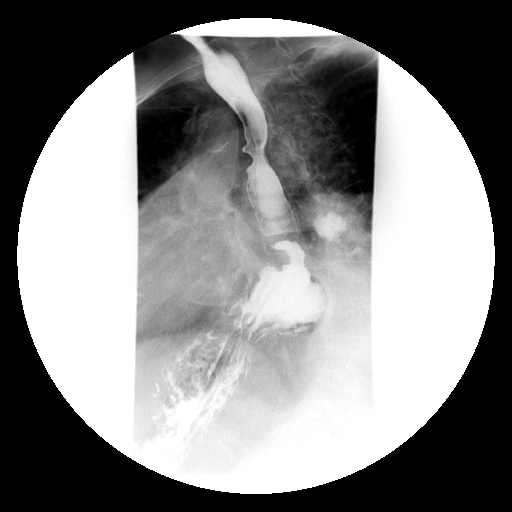

[Series 13: run · 1 of 1 slices shown (10 of 11)]
[im 1/1]
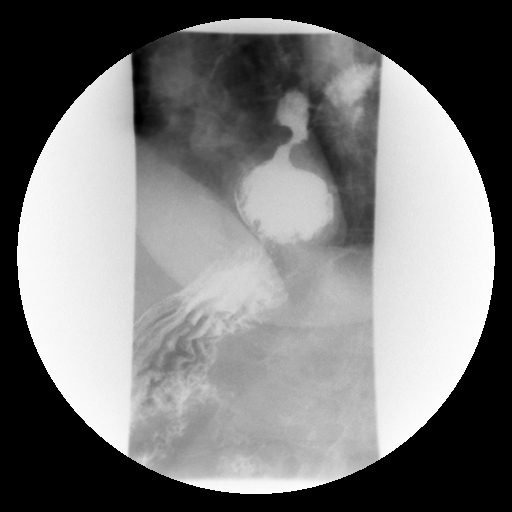

[Series 14: run · 1 of 1 slices shown (11 of 11)]
[im 1/1]
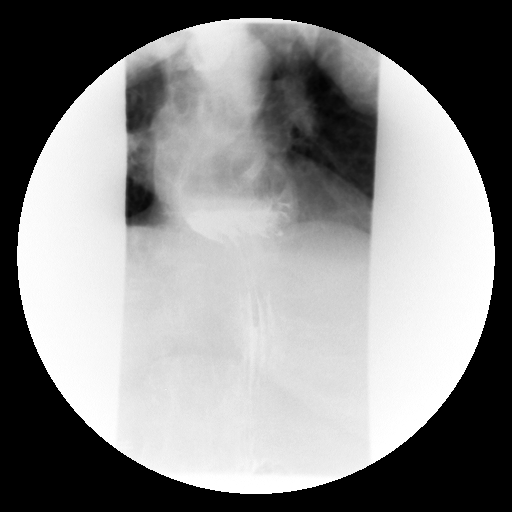

[19 of 24 positions shown; findings below may reference images not displayed]

FINDINGS: The study was begun with the patient in the erect lateral position
to assess for possible aspiration in view of coughing after
swallowing. Rapid sequence spot films show no evidence for
aspiration or penetration. A slight prominent slightly prominent
cricopharyngeus muscle is noted. On the frontal view there is some
deviation of the lower cervical esophagus to the left of midline
which could indicate enlargement of the right lobe of thyroid or a
thyroid nodule. Clinical correlation is recommended. Mild tertiary
contractions are noted. A moderate size hiatal hernia is again
noted. Very mild gastroesophageal reflux is seen. A barium pill was
given at the end of the study which did pass into the stomach
without delay.
IMPRESSION: 1. Moderate size hiatal hernia with mild gastroesophageal reflux.
Barium pill passes into the stomach without delay.
2. Mild tertiary contractions.
3. No aspiration or penetration.
4. Slight deviation of the lower cervical esophagus to the left.
Possible enlargement of the right lobe of thyroid or thyroid nodule.
Correlate clinically.

## 2015-12-17 NOTE — Therapy (Signed)
[facility] [address] [place], [place], [address] Phone: [phone]   Fax:  [phone]  Physical Therapy Evaluation  Patient Details  Name: Kim Colon MRN: [id number] Date of Birth: 03-08-1933 Referring Provider: Martha Clan, MD  Encounter Date: 12/17/2015    Past Medical History:  Diagnosis Date  . Arthritis   . Barrett esophagus   . Chronic back pain   . Chronic epigastric pain   . Colon polyps   . Daily headache   . Depression   . GERD (gastroesophageal reflux disease)   . Hip fracture (HCC)    left  . History of hiatal hernia   . HTN (hypertension)   . Hyperlipemia   . Kyphosis   . Osteoporosis with fracture    T12,T11, T10  . Pulmonary nodule 2006  . Restrictive lung disease    "because of the way my back is"  . Shingles   . Sleep apnea    "went thru study; found evidence of sleep apnea; never got a mask" (12/26/2014)  . Vitamin D deficiency disease     Past Surgical History:  Procedure Laterality Date  . APPENDECTOMY    . BUNIONECTOMY Right 08/2010  . CATARACT EXTRACTION W/ INTRAOCULAR LENS  IMPLANT, BILATERAL Bilateral   . CHOLECYSTECTOMY OPEN    . DILATION AND CURETTAGE OF UTERUS  1060's   "might have done one when I miscarried"  . ESOPHAGOGASTRODUODENOSCOPY (EGD) WITH ESOPHAGEAL DILATION  X 2?  . FRACTURE SURGERY    . ORIF HIP FRACTURE Left 10/2009  . TOE SURGERY Right 08/2010   "took some bones out of the 2 toes next to my big toe"  . TONSILLECTOMY    . TOTAL ABDOMINAL HYSTERECTOMY     w/BSO  . TUBAL LIGATION  1973  . VERTEBROPLASTY     T8    There were no vitals filed for this visit.       Subjective Assessment - 12/17/15 1239    Subjective She reports neck pain with abnormal posture. She is worse since last yearrs fall/ She reports she is unsteady. She has been to neurosurgeon and he issued a soft collar that irritates  her reflux.    Patient is accompained by: Family member   Limitations House hold activities;Walking;Sitting;Standing   Diagnostic tests last xrays last year   Patient Stated Goals Decreased pain   Currently in Pain? No/denies   Pain Score --  in last week moderate pain in neck   Pain Location Neck   Pain Orientation Posterior   Pain Descriptors / Indicators Aching   Pain Type Chronic pain   Pain Onset More than a month ago   Pain Frequency Intermittent   Aggravating Factors  Aching and fatigue with holding head    Pain Relieving Factors tylenol   Multiple Pain Sites --  none to be addtressed today            [facility] PT Assessment - 12/17/15 0001      Assessment   Medical Diagnosis neck pain   Referring Provider Martha Clan, MD   Onset Date/Surgical Date --  years ago, worse after fall in 2016   Next MD Visit As needed   Prior Therapy no but has had therapy for back in past     Precautions   Precautions Fall   Precaution Comments osteoporosis     Restrictions   Weight Bearing Restrictions No     Balance Screen   Has the patient  fallen in the past 6 months No  LOB incidents but no falls to floor   Has the patient had a decrease in activity level because of a fear of falling?  Yes   Is the patient reluctant to leave their home because of a fear of falling?  No     Prior Function   Level of Independence Needs assistance with gait;Needs assistance with homemaking;Needs assistance with ADLs   Vocation Retired     IT consultant   Overall Cognitive Status Within Functional Limits for tasks assessed     Observation/Other Assessments   Focus on Therapeutic Outcomes (FOTO)  63%     Posture/Postural Control   Posture Comments Sever increased kyphosis/scoliosis with forward head and rounded shoulders     ROM / Strength   AROM / PROM / Strength AROM;Strength     AROM   Overall AROM Comments motions done active assistive   AROM Assessment Site Cervical   Cervical Flexion full   Cervical Extension 20   Cervical - Right Side Bend  20   Cervical - Left Side Bend 20   Cervical - Right Rotation 40   Cervical - Left Rotation 40     Strength   Overall Strength Comments Normal UE strength     Palpation   Palpation comment mild tnederness but tension noted in cervical pars;pinals and traps , SCM     Ambulation/Gait   Gait Comments Needed +2 assist due to balance and she used wheelchair to leave clinic.                              PT Short Term Goals - 01/07/2016 1330      PT SHORT TERM GOAL #1   Title She will be independent with intial HEP   Time 2   Period Weeks   Status New     PT SHORT TERM GOAL #2   Title She wll report 25% decreaased soreness and fatigue   Time 3   Period Weeks   Status New           PT Long Term Goals - January 07, 2016 1331      PT LONG TERM GOAL #1   Title She will be independent with all HEP issued    Time 6   Period Weeks   Status New     PT LONG TERM GOAL #2   Title She will report pain and fatigue decr 50% or more with standing    Time 6   Period Weeks   Status New     PT LONG TERM GOAL #3   Title She will be able to assume crrect posture without cuing or asssitance   Time 6   Period Weeks   Status New             Patient will benefit from skilled therapeutic intervention in order to improve the following deficits and impairments:     Visit Diagnosis: Cervicalgia  Abnormal posture  Muscle weakness (generalized)      G-Codes - 01-07-16 1335    Functional Assessment Tool Used FOTO 63% limited   Functional Limitation Changing and maintaining body position   Changing and Maintaining Body Position Current Status (Z6109) At least 60 percent but less than 80 percent impaired, limited or restricted   Changing and Maintaining Body Position Goal Status (U0454) At least 40 percent but less than 60 percent impaired, limited or restricted  Problem List Patient Active Problem List   Diagnosis Date Noted  . Dyspnea on exertion   .  Other fatigue   . Palpitations   . Fatigue 12/26/2014  . Chest pain 12/26/2014  . Restrictive lung disease 01/05/2012  . GERD (gastroesophageal reflux disease) 01/05/2012  . Obstructive sleep apnea 01/05/2012  . Hypertension 01/05/2012    Caprice Red  PT 12/17/2015, 1:44 PM  [facility] [address] [place], [place], [address] Phone: [phone]   Fax:  [phone]  Name: CANDIACE GATHRIGHT MRN: [id number] Date of Birth: 10-28-1932

## 2015-12-20 ENCOUNTER — Encounter: Payer: Self-pay | Admitting: Physical Therapy

## 2015-12-20 ENCOUNTER — Ambulatory Visit: Payer: Medicare Other | Admitting: Physical Therapy

## 2015-12-20 DIAGNOSIS — R42 Dizziness and giddiness: Secondary | ICD-10-CM

## 2015-12-20 DIAGNOSIS — R2689 Other abnormalities of gait and mobility: Secondary | ICD-10-CM

## 2015-12-20 DIAGNOSIS — M542 Cervicalgia: Secondary | ICD-10-CM | POA: Diagnosis not present

## 2015-12-23 NOTE — Therapy (Signed)
[facility] [address] [place], [place], [address] Phone: [phone]   Fax:  [phone]  Physical Therapy Evaluation  Patient Details  Name: Kim Colon MRN: [id number] Date of Birth: 1932/12/16 Referring Provider: Martha Clan, MD  Encounter Date: 12/20/2015      PT End of Session - 12/23/15 2146    Visit Number 1   Number of Visits 5   Date for PT Re-Evaluation 01/20/16   Authorization Type [employer] Medicare   Authorization Time Period 12-20-15 - 02-18-16   PT Start Time 1100   PT Stop Time 1150   PT Time Calculation (min) 50 min      Past Medical History:  Diagnosis Date  . Arthritis   . Barrett esophagus   . Chronic back pain   . Chronic epigastric pain   . Colon polyps   . Daily headache   . Depression   . GERD (gastroesophageal reflux disease)   . Hip fracture (HCC)    left  . History of hiatal hernia   . HTN (hypertension)   . Hyperlipemia   . Kyphosis   . Osteoporosis with fracture    T12,T11, T10  . Pulmonary nodule 2006  . Restrictive lung disease    "because of the way my back is"  . Shingles   . Sleep apnea    "went thru study; found evidence of sleep apnea; never got a mask" (12/26/2014)  . Vitamin D deficiency disease     Past Surgical History:  Procedure Laterality Date  . APPENDECTOMY    . BUNIONECTOMY Right 08/2010  . CATARACT EXTRACTION W/ INTRAOCULAR LENS  IMPLANT, BILATERAL Bilateral   . CHOLECYSTECTOMY OPEN    . DILATION AND CURETTAGE OF UTERUS  1060's   "might have done one when I miscarried"  . ESOPHAGOGASTRODUODENOSCOPY (EGD) WITH ESOPHAGEAL DILATION  X 2?  . FRACTURE SURGERY    . ORIF HIP FRACTURE Left 10/2009  . TOE SURGERY Right 08/2010   "took some bones out of the 2 toes next to my big toe"  . TONSILLECTOMY    . TOTAL ABDOMINAL HYSTERECTOMY     w/BSO  . TUBAL LIGATION  1973  . VERTEBROPLASTY     T8    There were no vitals filed for this visit.        Subjective Assessment - 12/23/15 2138    Subjective Pt reports a fall in May 2016 in which she hit her head; reports vertigo has increased since this fall; reports dizziness is constant but is not really a spinning sensation  Pt presents to PT in a transport wheelchair - has a cane which she uses for assistance with amb.   Patient is accompained by: Family member  husband   Limitations House hold activities;Walking;Sitting;Standing   Diagnostic tests last xrays last year   Patient Stated Goals resolve the vertigo and improve balance   Currently in Pain? Yes            [facility] PT Assessment - 12/23/15 0001      Assessment   Medical Diagnosis BPPV   Referring Provider Martha Clan, MD   Next MD Visit As needed   Prior Therapy is currently receiving PT for neck      Precautions   Precautions Fall     Balance Screen   Has the patient fallen in the past 6 months No   Has the patient had a decrease in activity level because of a fear of  falling?  Yes   Is the patient reluctant to leave their home because of a fear of falling?  No     Prior Function   Level of Independence Independent with household mobility with device;Independent with basic ADLs   Vocation Retired            Vestibular Assessment - 12/23/15 0001      Vestibular Assessment   General Observation Pt is an 82 year old lady with c/o vertigo and imbalance especially when she gets up to walk; had a fall in May 2016 and states balance and dizziness have been worse since that fall; has c/o neck discomfort and difficulty holding head up with fatigue since that fall     Symptom Behavior   Type of Dizziness "Funny feeling in head"   Frequency of Dizziness daily    Duration of Dizziness mostly constant but worse at times   Aggravating Factors Activity in general   Relieving Factors Rest                       PT Education - 12/23/15 2145    Education provided Yes   Education Details Recommended  use of RW to incr. safety with ambulation and decr. fall risk   Person(s) Educated Patient   Methods Explanation;Demonstration   Comprehension Verbalized understanding;Returned demonstration;Need further instruction          PT Short Term Goals - 12/17/15 1330      PT SHORT TERM GOAL #1   Title She will be independent with intial HEP   Time 2   Period Weeks   Status New     PT SHORT TERM GOAL #2   Title She wll report 25% decreaased soreness and fatigue   Time 3   Period Weeks   Status New           PT Long Term Goals - 12/23/15 2156      PT LONG TERM GOAL #1   Title Pt will report at least 50% improvement in vertigo.  (01-20-16)   Time 4   Period Weeks   Status New     PT LONG TERM GOAL #2   Title Amb. 125' with RW with S on flat, even surface.  (01-20-16)   Time 4   Period Weeks   Status New     PT LONG TERM GOAL #3   Title Independent in HEP for balance/vestibular exercises.  (01-20-16)   Time 4   Period Weeks   Status New     PT LONG TERM GOAL #4   Title Improve TUG score by at least 5 secs to demo improved functional mobility.  (01-20-16)   Time 4   Period Weeks   Status New               Plan - 12/23/15 2147    Clinical Impression Statement Pt is an 82 year old lady with c/o vertigo/imbalance/dysequilibrium which has increased since fall in May 2016 in which she reports she hit the back of her head.  No signs or symptoms of BPPV at this time - more symptoms consistent with central vestibular dysfunction.  Pt is receiving ortho PT to address neck and posture dysfunction - pt is unable to hold head erect., demonstrates significant weak cervical extensors.  PMH includes osteoporosis, L hip fracture 2011, HTN, and chronic back pain and vertebroplasty.   Rehab Potential Good   PT Frequency 1x / week  PT Duration 4 weeks   PT Treatment/Interventions ADLs/Self Care Home Management;Therapeutic activities;Stair training;Gait training;DME  Instruction;Therapeutic exercise;Balance training;Patient/family education;Vestibular;Passive range of motion   PT Next Visit Plan gait train with RW; HEP for balance   PT Home Exercise Plan balance HEP   Consulted and Agree with Plan of Care Patient;Family member/caregiver   Family Member Consulted husband      Patient will benefit from skilled therapeutic intervention in order to improve the following deficits and impairments:  Dizziness, Decreased balance, Difficulty walking, Decreased strength, Postural dysfunction, Decreased range of motion  Visit Diagnosis: Dizziness and giddiness - Plan: PT plan of care cert/re-cert  Other abnormalities of gait and mobility - Plan: PT plan of care cert/re-cert     Problem List Patient Active Problem List   Diagnosis Date Noted  . Dyspnea on exertion   . Other fatigue   . Palpitations   . Fatigue 12/26/2014  . Chest pain 12/26/2014  . Restrictive lung disease 01/05/2012  . GERD (gastroesophageal reflux disease) 01/05/2012  . Obstructive sleep apnea 01/05/2012  . Hypertension 01/05/2012    Kary Kos, PT 12/23/2015, 10:07 PM  [facility] [address] [place], [place], [address] Phone: [phone]   Fax:  [phone]  Name: Kim Colon MRN: [id number] Date of Birth: 08-02-1932

## 2015-12-25 ENCOUNTER — Other Ambulatory Visit: Payer: Self-pay | Admitting: Internal Medicine

## 2015-12-25 ENCOUNTER — Ambulatory Visit: Payer: Medicare Other | Attending: Internal Medicine

## 2015-12-25 DIAGNOSIS — R293 Abnormal posture: Secondary | ICD-10-CM | POA: Insufficient documentation

## 2015-12-25 DIAGNOSIS — M542 Cervicalgia: Secondary | ICD-10-CM | POA: Diagnosis present

## 2015-12-25 DIAGNOSIS — R2689 Other abnormalities of gait and mobility: Secondary | ICD-10-CM | POA: Insufficient documentation

## 2015-12-25 DIAGNOSIS — M6281 Muscle weakness (generalized): Secondary | ICD-10-CM | POA: Diagnosis present

## 2015-12-25 DIAGNOSIS — R42 Dizziness and giddiness: Secondary | ICD-10-CM | POA: Diagnosis present

## 2015-12-25 NOTE — Therapy (Signed)
[facility] [address] [place], [place], [address] Phone: [phone]   Fax:  [phone]  Physical Therapy Treatment  Patient Details  Name: Kim Colon MRN: [id number] Date of Birth: 02-Jan-1933 Referring Provider: Martha Clan, MD  Encounter Date: 12/25/2015      PT End of Session - 12/25/15 1015    Visit Number 2   Number of Visits 12   Date for PT Re-Evaluation 02/01/16   Authorization Type UHC Medicare   PT Start Time 0930   PT Stop Time 1015   PT Time Calculation (min) 45 min   Activity Tolerance Patient tolerated treatment well   Behavior During Therapy [facility] for tasks assessed/performed      Past Medical History:  Diagnosis Date  . Arthritis   . Barrett esophagus   . Chronic back pain   . Chronic epigastric pain   . Colon polyps   . Daily headache   . Depression   . GERD (gastroesophageal reflux disease)   . Hip fracture (HCC)    left  . History of hiatal hernia   . HTN (hypertension)   . Hyperlipemia   . Kyphosis   . Osteoporosis with fracture    T12,T11, T10  . Pulmonary nodule 2006  . Restrictive lung disease    "because of the way my back is"  . Shingles   . Sleep apnea    "went thru study; found evidence of sleep apnea; never got a mask" (12/26/2014)  . Vitamin D deficiency disease     Past Surgical History:  Procedure Laterality Date  . APPENDECTOMY    . BUNIONECTOMY Right 08/2010  . CATARACT EXTRACTION W/ INTRAOCULAR LENS  IMPLANT, BILATERAL Bilateral   . CHOLECYSTECTOMY OPEN    . DILATION AND CURETTAGE OF UTERUS  1060's   "might have done one when I miscarried"  . ESOPHAGOGASTRODUODENOSCOPY (EGD) WITH ESOPHAGEAL DILATION  X 2?  . FRACTURE SURGERY    . ORIF HIP FRACTURE Left 10/2009  . TOE SURGERY Right 08/2010   "took some bones out of the 2 toes next to my big toe"  . TONSILLECTOMY    . TOTAL ABDOMINAL HYSTERECTOMY     w/BSO  . TUBAL LIGATION  1973  . VERTEBROPLASTY     T8     There were no vitals filed for this visit.      Subjective Assessment - 12/25/15 0931    Subjective Saw PT in neuro felt dizziness may be inner ear .  She will be seen in neuro for a few sessions. No changes   Currently in Pain? Yes   Pain Score --  mild to mod   Pain Location Neck   Pain Orientation Posterior   Pain Descriptors / Indicators Aching   Pain Type Chronic pain   Pain Onset More than a month ago   Pain Frequency Intermittent   Aggravating Factors  fatigue ach holding head up   Pain Relieving Factors medication   Multiple Pain Sites No                         [facility] Adult PT Treatment/Exercise - 12/25/15 0954      Exercises   Exercises Neck     Neck Exercises: Supine   Other Supine Exercise Shoulder and scapula ROm and retraction  and also reching to ankles and to ceiling RT or LT to work on thoracic rotation to RT and RT shoulder scapula retraction  with some PT overpressure.  Also did some bridging and some contract relax with stretching neck     Manual Therapy   Joint Mobilization Gr 2-3 PA C1-7  multiple bouts between other manual treatments   Soft tissue mobilization sub occipital and paraspinals more to RT   Myofascial Release sub occipital release   Passive ROM rotation and sidebending  retraction   Manual Traction neck multiple reps.      Also seated postural exercise with 10-20 sec hold in best postion           PT Education - 12/25/15 1015    Education provided Yes   Education Details iscussed need to keep working for a long while to see benefits   Person(s) Educated Patient;Spouse   Methods Explanation   Comprehension Verbalized understanding          PT Short Term Goals - 12/17/15 1330      PT SHORT TERM GOAL #1   Title She will be independent with intial HEP   Time 2   Period Weeks   Status New     PT SHORT TERM GOAL #2   Title She wll report 25% decreaased soreness and fatigue   Time 3   Period Weeks    Status New           PT Long Term Goals - 12/23/15 2156      PT LONG TERM GOAL #1   Title Pt will report at least 50% improvement in vertigo.  (01-20-16)   Time 4   Period Weeks   Status New     PT LONG TERM GOAL #2   Title Amb. 125' with RW with S on flat, even surface.  (01-20-16)   Time 4   Period Weeks   Status New     PT LONG TERM GOAL #3   Title Independent in HEP for balance/vestibular exercises.  (01-20-16)   Time 4   Period Weeks   Status New     PT LONG TERM GOAL #4   Title Improve TUG score by at least 5 secs to demo improved functional mobility.  (01-20-16)   Time 4   Period Weeks   Status New               Plan - 12/25/15 1021    Clinical Impression Statement Ms Estabrook tolerated treatment without incr pain. i explained again the limitations on what we can do and what we are addressing to see if she can benefit from PT. Need to ad dto HEP   PT Treatment/Interventions ADLs/Self Care Home Management;Therapeutic activities;Stair training;Gait training;DME Instruction;Therapeutic exercise;Balance training;Patient/family education;Vestibular;Passive range of motion   PT Next Visit Plan MAnual , strengthening  upper 1/4 , posture   Consulted and Agree with Plan of Care Patient;Family member/caregiver   Family Member Consulted husband      Patient will benefit from skilled therapeutic intervention in order to improve the following deficits and impairments:  Dizziness, Decreased balance, Difficulty walking, Decreased strength, Postural dysfunction, Decreased range of motion  Visit Diagnosis: Cervicalgia  Abnormal posture  Muscle weakness (generalized)     Problem List Patient Active Problem List   Diagnosis Date Noted  . Dyspnea on exertion   . Other fatigue   . Palpitations   . Fatigue 12/26/2014  . Chest pain 12/26/2014  . Restrictive lung disease 01/05/2012  . GERD (gastroesophageal reflux disease) 01/05/2012  . Obstructive sleep apnea  01/05/2012  . Hypertension 01/05/2012  Caprice Red  PT 12/25/2015, 10:23 AM  [facility] [address] [place], [place], [address] Phone: [phone]   Fax:  [phone]  Name: Kim Colon MRN: [id number] Date of Birth: Oct 01, 1932

## 2015-12-26 ENCOUNTER — Ambulatory Visit: Payer: Medicare Other | Admitting: Physical Therapy

## 2015-12-26 DIAGNOSIS — R2689 Other abnormalities of gait and mobility: Secondary | ICD-10-CM

## 2015-12-26 DIAGNOSIS — R42 Dizziness and giddiness: Secondary | ICD-10-CM

## 2015-12-26 DIAGNOSIS — M542 Cervicalgia: Secondary | ICD-10-CM | POA: Diagnosis not present

## 2015-12-26 DIAGNOSIS — R293 Abnormal posture: Secondary | ICD-10-CM

## 2015-12-26 DIAGNOSIS — M6281 Muscle weakness (generalized): Secondary | ICD-10-CM

## 2015-12-26 NOTE — Patient Instructions (Addendum)
ABDUCTION: Standing (Active)    Hold counter for support.  Stand, feet flat. Lift right leg out to side. Do 10 times on each leg.  Do once a day.   http://gtsc.exer.us/111   Copyright  VHI. All rights reserved.  HIP / KNEE: Extension - Standing    Hold counter for support.  Raise and lift leg backward. Keep knee straight or slightly bent. Do 10 times on each side.  Do once a day.  Copyright  VHI. All rights reserved.   "I love a Production manager for support.  March in place raising knees as high as possible. Repeat 10 times. Do 1 sessions per day.  http://gt2.exer.us/345   Copyright  VHI. All rights reserved.   Toe / Heel Raise (Standing)    Hold counter for support.  Standing with support, raise heels, then rock back on heels and raise toes. Repeat 10 times.  Copyright  VHI. All rights reserved.   Functional Quadriceps: Sit to Stand    Sit on edge of chair, feet flat on floor. Stand upright, extending knees fully. Repeat 10 times per set. Do 1 sessions per day.  http://orth.exer.us/735   Copyright  VHI. All rights reserved.

## 2015-12-26 NOTE — Therapy (Signed)
[facility] [address] [place], [place], [address] Phone: [phone]   Fax:  [phone]  Physical Therapy Treatment  Patient Details  Name: Kim Colon MRN: [id number] Date of Birth: 09/08/1932 Referring Provider: Martha Clan, MD  Encounter Date: 12/26/2015      PT End of Session - 12/26/15 1547    Visit Number 3   Number of Visits 12   Date for PT Re-Evaluation 02/01/16   Authorization Type UHC Medicare   PT Start Time 1030   PT Stop Time 1108   PT Time Calculation (min) 38 min   Equipment Utilized During Treatment Gait belt   Activity Tolerance Patient tolerated treatment well   Behavior During Therapy WFL for tasks assessed/performed      Past Medical History:  Diagnosis Date  . Arthritis   . Barrett esophagus   . Chronic back pain   . Chronic epigastric pain   . Colon polyps   . Daily headache   . Depression   . GERD (gastroesophageal reflux disease)   . Hip fracture (HCC)    left  . History of hiatal hernia   . HTN (hypertension)   . Hyperlipemia   . Kyphosis   . Osteoporosis with fracture    T12,T11, T10  . Pulmonary nodule 2006  . Restrictive lung disease    "because of the way my back is"  . Shingles   . Sleep apnea    "went thru study; found evidence of sleep apnea; never got a mask" (12/26/2014)  . Vitamin D deficiency disease     Past Surgical History:  Procedure Laterality Date  . APPENDECTOMY    . BUNIONECTOMY Right 08/2010  . CATARACT EXTRACTION W/ INTRAOCULAR LENS  IMPLANT, BILATERAL Bilateral   . CHOLECYSTECTOMY OPEN    . DILATION AND CURETTAGE OF UTERUS  1060's   "might have done one when I miscarried"  . ESOPHAGOGASTRODUODENOSCOPY (EGD) WITH ESOPHAGEAL DILATION  X 2?  . FRACTURE SURGERY    . ORIF HIP FRACTURE Left 10/2009  . TOE SURGERY Right 08/2010   "took some bones out of the 2 toes next to my big toe"  . TONSILLECTOMY    . TOTAL ABDOMINAL HYSTERECTOMY     w/BSO   . TUBAL LIGATION  1973  . VERTEBROPLASTY     T8    There were no vitals filed for this visit.      Subjective Assessment - 12/26/15 1031    Subjective Pt still states she feels dizzy and wonders if it could be something with her vision.   Patient is accompained by: Family member   Limitations House hold activities;Walking;Sitting;Standing   Diagnostic tests last xrays last year   Patient Stated Goals resolve the vertigo and improve balance   Currently in Pain? Yes   Pain Score --  none at rest but "sore" when moves it a certain way   Pain Location Neck   Pain Orientation Posterior   Pain Descriptors / Indicators Aching   Pain Type Chronic pain   Pain Onset More than a month ago   Pain Frequency Intermittent   Aggravating Factors  moving a certain way   Pain Relieving Factors medication           [facility] Adult PT Treatment/Exercise - 12/26/15 0001      Transfers   Transfers Sit to Stand;Stand to Sit   Number of Reps 10 reps   Comments performed as strengthening exercise-provided as HEP  Ambulation/Gait   Ambulation/Gait Yes   Ambulation/Gait Assistance 4: Min guard   Ambulation Distance (Feet) 240 Feet  50 twice   Assistive device Rolling walker   Gait Pattern Decreased step length - right;Decreased step length - left;Decreased hip/knee flexion - right;Decreased hip/knee flexion - left;Decreased dorsiflexion - right;Decreased dorsiflexion - left;Decreased weight shift to right;Decreased weight shift to left;Trunk flexed   Ambulation Surface Level;Indoor     Posture/Postural Control   Posture/Postural Control Postural limitations   Postural Limitations Rounded Shoulders;Forward head   Posture Comments Sever increased kyphosis/scoliosis with forward head and rounded shoulders     Knee/Hip Exercises: Standing   Heel Raises Both;10 reps   Hip Flexion Both;10 reps;Knee bent   Hip Abduction Both;10 reps;Knee straight   Hip Extension Both;10 reps;Knee straight    Other Standing Knee Exercises exercises performed at counter with bil UE support                PT Education - 12/26/15 1546    Education provided Yes   Education Details HEP, awaiting MD order for youth RW   Person(s) Educated Patient;Spouse   Methods Explanation;Demonstration;Verbal cues;Handout   Comprehension Verbalized understanding          PT Short Term Goals - 12/17/15 1330      PT SHORT TERM GOAL #1   Title She will be independent with intial HEP   Time 2   Period Weeks   Status New     PT SHORT TERM GOAL #2   Title She wll report 25% decreaased soreness and fatigue   Time 3   Period Weeks   Status New           PT Long Term Goals - 12/23/15 2156      PT LONG TERM GOAL #1   Title Pt will report at least 50% improvement in vertigo.  (01-20-16)   Time 4   Period Weeks   Status New     PT LONG TERM GOAL #2   Title Amb. 125' with RW with S on flat, even surface.  (01-20-16)   Time 4   Period Weeks   Status New     PT LONG TERM GOAL #3   Title Independent in HEP for balance/vestibular exercises.  (01-20-16)   Time 4   Period Weeks   Status New     PT LONG TERM GOAL #4   Title Improve TUG score by at least 5 secs to demo improved functional mobility.  (01-20-16)   Time 4   Period Weeks   Status New               Plan - 12/26/15 1548    Clinical Impression Statement Pt continues to c/o "head not feeling right" and being off balance.  Going to opthamologist next week to check vision.  Continue PT per POC.   PT Treatment/Interventions ADLs/Self Care Home Management;Therapeutic activities;Stair training;Gait training;DME Instruction;Therapeutic exercise;Balance training;Patient/family education;Vestibular;Passive range of motion   PT Next Visit Plan Review HEP and revise as needed.  Gait with RW.  Check on MD order for RW in Epic.   PT Home Exercise Plan balance HEP   Consulted and Agree with Plan of Care Patient;Family member/caregiver    Family Member Consulted husband      Patient will benefit from skilled therapeutic intervention in order to improve the following deficits and impairments:  Dizziness, Decreased balance, Difficulty walking, Decreased strength, Postural dysfunction, Decreased range of motion  Visit Diagnosis: Other  abnormalities of gait and mobility  Muscle weakness (generalized)  Dizziness and giddiness  Abnormal posture     Problem List Patient Active Problem List   Diagnosis Date Noted  . Dyspnea on exertion   . Other fatigue   . Palpitations   . Fatigue 12/26/2014  . Chest pain 12/26/2014  . Restrictive lung disease 01/05/2012  . GERD (gastroesophageal reflux disease) 01/05/2012  . Obstructive sleep apnea 01/05/2012  . Hypertension 01/05/2012    Newell Coral 12/26/2015, 3:50 PM  [facility] [address] [place], [place], [address] Phone: [phone]   Fax:  [phone]  Name: Kim Colon MRN: [id number] Date of Birth: 17-Mar-1933   Newell Coral, [place] [facility] 12/26/15 3:51 PM Phone: [phone] Fax: [phone]

## 2016-01-01 ENCOUNTER — Ambulatory Visit: Payer: Medicare Other

## 2016-01-01 DIAGNOSIS — M6281 Muscle weakness (generalized): Secondary | ICD-10-CM

## 2016-01-01 DIAGNOSIS — R293 Abnormal posture: Secondary | ICD-10-CM

## 2016-01-01 DIAGNOSIS — M542 Cervicalgia: Secondary | ICD-10-CM

## 2016-01-01 NOTE — Patient Instructions (Signed)
From cabinet unattached scapula strength 1x/day 10-20 reps  Hold 2 sec

## 2016-01-01 NOTE — Therapy (Signed)
[facility] [address] [place], [place], [address] Phone: [phone]   Fax:  [phone]  Physical Therapy Treatment  Patient Details  Name: Kim Colon MRN: [id number] Date of Birth: [birth year] Referring Provider: Martha Clan, MD  Encounter Date: 01/01/2016      PT End of Session - 01/01/16 1245    Visit Number 5  3 for ortho    Number of Visits 12   Date for PT Re-Evaluation 02/01/16   Authorization Type U[employer] Medicare   PT Start Time 1230   PT Stop Time 1316   PT Time Calculation (min) 46 min   Activity Tolerance Patient tolerated treatment well   Behavior During Therapy [facility] for tasks assessed/performed      Past Medical History:  Diagnosis Date  . Arthritis   . Barrett esophagus   . Chronic back pain   . Chronic epigastric pain   . Colon polyps   . Daily headache   . Depression   . GERD (gastroesophageal reflux disease)   . Hip fracture (HCC)    left  . History of hiatal hernia   . HTN (hypertension)   . Hyperlipemia   . Kyphosis   . Osteoporosis with fracture    T12,T11, T10  . Pulmonary nodule 2006  . Restrictive lung disease    "because of the way my back is"  . Shingles   . Sleep apnea    "went thru study; found evidence of sleep apnea; never got a mask" (12/26/2014)  . Vitamin D deficiency disease     Past Surgical History:  Procedure Laterality Date  . APPENDECTOMY    . BUNIONECTOMY Right 08/2010  . CATARACT EXTRACTION W/ INTRAOCULAR LENS  IMPLANT, BILATERAL Bilateral   . CHOLECYSTECTOMY OPEN    . DILATION AND CURETTAGE OF UTERUS  1060's   "might have done one when I miscarried"  . ESOPHAGOGASTRODUODENOSCOPY (EGD) WITH ESOPHAGEAL DILATION  X 2?  . FRACTURE SURGERY    . ORIF HIP FRACTURE Left 10/2009  . TOE SURGERY Right 08/2010   "took some bones out of the 2 toes next to my big toe"  . TONSILLECTOMY    . TOTAL ABDOMINAL HYSTERECTOMY     w/BSO  . TUBAL LIGATION  1973  . VERTEBROPLASTY      T8    There were no vitals filed for this visit.      Subjective Assessment - 01/01/16 1234    Subjective Continues with mild soreness but no sharp pains today   Currently in Pain? Yes   Pain Score --  no number given   Pain Location Neck   Pain Orientation Posterior;Right;Left   Pain Descriptors / Indicators Sore   Pain Type Chronic pain   Pain Onset More than a month ago   Pain Frequency Intermittent   Aggravating Factors  moving certain ways   Pain Relieving Factors med   Multiple Pain Sites No                         [facility] Adult PT Treatment/Exercise - 01/01/16 0001      Neck Exercises: Seated   Other Seated Exercise postural stabilization with flexing at hips wiuth body as erect as as able and reaching overhead with moving body as erect as able and sustian  5-10 sec with mild help from PT.        Neck Exercises: Supine   Neck Retraction 15 reps  Capital Flexion 10 reps   Shoulder ABduction Both;10 reps  2 sets yellow band , horizontal   Other Supine Exercise ER and shoulder extension yellow band x 15    Other Supine Exercise scapula retraction x 12 with manual cues for capital flexion       Manual Therapy   Soft tissue mobilization sub occipital and paraspinals more to RT   Myofascial Release sub occipital release   Passive ROM rotation and sidebending  retraction   Manual Traction neck multiple reps.                 PT Education - 01/01/16 1308    Education provided Yes   Education Details band exercise   Person(s) Educated Patient;Spouse   Methods Explanation;Tactile cues;Verbal cues;Handout   Comprehension Returned demonstration;Verbalized understanding          PT Short Term Goals - 01/01/16 1319      PT SHORT TERM GOAL #1   Title She will be independent with intial HEP   Status Achieved     PT SHORT TERM GOAL #2   Title She wll report 25% decreaased soreness and fatigue   Status On-going           PT Long  Term Goals - 12/23/15 2156      PT LONG TERM GOAL #1   Title Pt will report at least 50% improvement in vertigo.  (01-20-16)   Time 4   Period Weeks   Status New     PT LONG TERM GOAL #2   Title Amb. 125' with RW with S on flat, even surface.  (01-20-16)   Time 4   Period Weeks   Status New     PT LONG TERM GOAL #3   Title Independent in HEP for balance/vestibular exercises.  (01-20-16)   Time 4   Period Weeks   Status New     PT LONG TERM GOAL #4   Title Improve TUG score by at least 5 secs to demo improved functional mobility.  (01-20-16)   Time 4   Period Weeks   Status New               Plan - 01/01/16 1316    Clinical Impression Statement She reports doing HEP  but soreness continues.    PT Treatment/Interventions ADLs/Self Care Home Management;Therapeutic activities;Stair training;Gait training;DME Instruction;Therapeutic exercise;Balance training;Patient/family education;Vestibular;Passive range of motion   PT Next Visit Plan Continue resisted exercises and manual   PT Home Exercise Plan yellow band exercise    Consulted and Agree with Plan of Care Patient   Family Member Consulted husband      Patient will benefit from skilled therapeutic intervention in order to improve the following deficits and impairments:  Dizziness, Decreased balance, Difficulty walking, Decreased strength, Postural dysfunction, Decreased range of motion  Visit Diagnosis: Muscle weakness (generalized)  Abnormal posture  Cervicalgia     Problem List Patient Active Problem List   Diagnosis Date Noted  . Dyspnea on exertion   . Other fatigue   . Palpitations   . Fatigue 12/26/2014  . Chest pain 12/26/2014  . Restrictive lung disease 01/05/2012  . GERD (gastroesophageal reflux disease) 01/05/2012  . Obstructive sleep apnea 01/05/2012  . Hypertension 01/05/2012    Caprice Red  PT 01/01/2016, 1:20 PM  [facility] [address] [place], [place], [address] Phone: [phone]   Fax:  [phone]  Name: Kim Colon MRN: [id number]  Date of Birth: November 19, 1932

## 2016-01-08 ENCOUNTER — Ambulatory Visit: Payer: Medicare Other

## 2016-01-08 ENCOUNTER — Ambulatory Visit: Payer: Medicare Other | Admitting: Physical Therapy

## 2016-01-08 DIAGNOSIS — R42 Dizziness and giddiness: Secondary | ICD-10-CM

## 2016-01-08 DIAGNOSIS — M542 Cervicalgia: Secondary | ICD-10-CM

## 2016-01-08 DIAGNOSIS — R293 Abnormal posture: Secondary | ICD-10-CM

## 2016-01-08 DIAGNOSIS — M6281 Muscle weakness (generalized): Secondary | ICD-10-CM

## 2016-01-08 DIAGNOSIS — R2689 Other abnormalities of gait and mobility: Secondary | ICD-10-CM

## 2016-01-08 NOTE — Patient Instructions (Signed)
Issued decompression exer 5 reps 5-10 sec hold 1-2x/day . Also showed husband how to facilitate strength in sitting and good posture in sitting by giving pressure to posterior shoulders and head in good posture

## 2016-01-08 NOTE — Therapy (Signed)
[facility] [address] [place], [place], [address] Phone: [phone]   Fax:  [phone]  Physical Therapy Treatment  Patient Details  Name: Kim Colon MRN: [id number] Date of Birth: 12/17/1932 Referring Provider: Martha ClanWilliam Shaw, MD  Encounter Date: 01/08/2016      PT End of Session - 01/08/16 0928    Visit Number 6  4 ortho   Number of Visits 12   Date for PT Re-Evaluation 02/01/16   Authorization Type UHC Medicare   Authorization Time Period 12-20-15 - 02-18-16   PT Start Time 0930   PT Stop Time 1015   PT Time Calculation (min) 45 min   Activity Tolerance Patient tolerated treatment well   [facility] tasks assessed/performed      Past Medical History:  Diagnosis Date  . Arthritis   . Barrett esophagus   . Chronic back pain   . Chronic epigastric pain   . Colon polyps   . Daily headache   . Depression   . GERD (gastroesophageal reflux disease)   . Hip fracture (HCC)    left  . History of hiatal hernia   . HTN (hypertension)   . Hyperlipemia   . Kyphosis   . Osteoporosis with fracture    T12,T11, T10  . Pulmonary nodule 2006  . Restrictive lung disease    "because of the way my back is"  . Shingles   . Sleep apnea    "went thru study; found evidence of sleep apnea; never got a mask" (12/26/2014)  . Vitamin D deficiency disease     Past Surgical History:  Procedure Laterality Date  . APPENDECTOMY    . BUNIONECTOMY Right 08/2010  . CATARACT EXTRACTION W/ INTRAOCULAR LENS  IMPLANT, BILATERAL Bilateral   . CHOLECYSTECTOMY OPEN    . DILATION AND CURETTAGE OF UTERUS  1060's   "might have done one when I miscarried"  . ESOPHAGOGASTRODUODENOSCOPY (EGD) WITH ESOPHAGEAL DILATION  X 2?  . FRACTURE SURGERY    . ORIF HIP FRACTURE Left 10/2009  . TOE SURGERY Right 08/2010   "took some bones out of the 2 toes next to my big toe"  . TONSILLECTOMY    . TOTAL ABDOMINAL HYSTERECTOMY     w/BSO   . TUBAL LIGATION  1973  . VERTEBROPLASTY     T8    There were no vitals filed for this visit.      Subjective Assessment - 01/08/16 0953    Subjective Didn't sleep well last night . She reports multiple compression fractures over the years so sitting erect is difficlut. She says she feels some stronger.    Currently in Pain? Yes   Pain Score --  mild no number given   Pain Location Neck   Pain Orientation Right;Left;Posterior   Pain Descriptors / Indicators Sore   Pain Type Chronic pain   Pain Onset More than a month ago   Pain Frequency Intermittent   Aggravating Factors  turning head    Pain Relieving Factors med rest,   Multiple Pain Sites No                         [facility] Adult PT Treatment/Exercise - 01/08/16 0955      Neck Exercises: Seated   Other Seated Exercise sitting in wheelchair with manual cues and ferbal also for erect sitiing , depression of shoulders and retraction of head . Manual resistance to these postures  at head and shoulders 5-8 sec x 10 and 5-8 reps with husband.      Neck Exercises: Supine   Neck Retraction Limitations multiple reps with manual traction   Capital Flexion Limitations multiple reps with manual traction and without    Other Supine Exercise short arc bridge knee on bolster x 12 with cues to flex head on neck and tactile cues then decompression exercises for home.    Other Supine Exercise scapula retraction and depression x 12 with manual cues for capital flexion       Manual Therapy   Soft tissue mobilization sub occipital and paraspinals more to RT   Myofascial Release sub occipital release   Passive ROM rotation and sidebending  retraction   Manual Traction neck multiple reps.                 PT Education - 01/08/16 1024    Education provided Yes   Education Details decompression and sitting stability exercises   Person(s) Educated Patient;Spouse   Methods Explanation;Demonstration;Tactile cues;Verbal  cues;Handout   Comprehension Verbalized understanding;Returned demonstration          PT Short Term Goals - 01/01/16 1319      PT SHORT TERM GOAL #1   Title She will be independent with intial HEP   Status Achieved     PT SHORT TERM GOAL #2   Title She wll report 25% decreaased soreness and fatigue   Status On-going           PT Long Term Goals - 12/23/15 2156      PT LONG TERM GOAL #1   Title Pt will report at least 50% improvement in vertigo.  (01-20-16)   Time 4   Period Weeks   Status New     PT LONG TERM GOAL #2   Title Amb. 125' with RW with S on flat, even surface.  (01-20-16)   Time 4   Period Weeks   Status New     PT LONG TERM GOAL #3   Title Independent in HEP for balance/vestibular exercises.  (01-20-16)   Time 4   Period Weeks   Status New     PT LONG TERM GOAL #4   Title Improve TUG score by at least 5 secs to demo improved functional mobility.  (01-20-16)   Time 4   Period Weeks   Status New               Plan - 01/08/16 1610    Clinical Impression Statement No changes but she is able to do all the activity/exercises asked with cues to positioning of head and neck. Husband did sitting exercises correctly. She was alittle sore post but declined modality.    PT Treatment/Interventions ADLs/Self Care Home Management;Therapeutic activities;Stair training;Gait training;DME Instruction;Therapeutic exercise;Balance training;Patient/family education;Vestibular;Passive range of motion   PT Next Visit Plan Continue resisted exercises and manual   Consulted and Agree with Plan of Care Patient   Family Member Consulted husband      Patient will benefit from skilled therapeutic intervention in order to improve the following deficits and impairments:  Dizziness, Decreased balance, Difficulty walking, Decreased strength, Postural dysfunction, Decreased range of motion  Visit Diagnosis: Muscle weakness (generalized)  Abnormal  posture  Cervicalgia     Problem List Patient Active Problem List   Diagnosis Date Noted  . Dyspnea on exertion   . Other fatigue   . Palpitations   . Fatigue 12/26/2014  . Chest pain 12/26/2014  .  Restrictive lung disease 01/05/2012  . GERD (gastroesophageal reflux disease) 01/05/2012  . Obstructive sleep apnea 01/05/2012  . Hypertension 01/05/2012    Caprice RedChasse, Froylan Hobby M  PT 01/08/2016, 10:27 AM  [facility] [address] [place], [place], [address] Phone: [phone]   Fax:  [phone]  Name: Kim Colon MRN: [id number] Date of Birth: [birth year]

## 2016-01-12 NOTE — Patient Instructions (Signed)
Gaze Stabilization: Sitting    Keeping eyes on target on wall \\_10N  feet away, tilt head down 15-30 and move head side to side for _60___ seconds. Repeat while moving head up and down for _60___ seconds. Do __3__ sessions per day. Repeat using target on pattern background.  Copyright  VHI. All rights reserved.

## 2016-01-12 NOTE — Therapy (Signed)
[facility] [address] [place], [place], [address] Phone: [phone]   Fax:  [phone]  Physical Therapy Treatment  Patient Details  Name: Kim Colon Dam MRN: [id number] Date of Birth: 04-22-1933 Referring Provider: Martha ClanWilliam Shaw, MD  Encounter Date: 01/08/2016      PT End of Session - 01/12/16 2128    Visit Number 7  3rd visit neuro   Number of Visits 12   Date for PT Re-Evaluation 02/01/16   Authorization Type UHC Medicare   Authorization Time Period 12-20-15 - 02-18-16   PT Start Time 1315   PT Stop Time 1400   PT Time Calculation (min) 45 min   Equipment Utilized During Treatment Gait belt      Past Medical History:  Diagnosis Date  . Arthritis   . Barrett esophagus   . Chronic back pain   . Chronic epigastric pain   . Colon polyps   . Daily headache   . Depression   . GERD (gastroesophageal reflux disease)   . Hip fracture (HCC)    left  . History of hiatal hernia   . HTN (hypertension)   . Hyperlipemia   . Kyphosis   . Osteoporosis with fracture    T12,T11, T10  . Pulmonary nodule 2006  . Restrictive lung disease    "because of the way my back is"  . Shingles   . Sleep apnea    "went thru study; found evidence of sleep apnea; never got a mask" (12/26/2014)  . Vitamin D deficiency disease     Past Surgical History:  Procedure Laterality Date  . APPENDECTOMY    . BUNIONECTOMY Right 08/2010  . CATARACT EXTRACTION W/ INTRAOCULAR LENS  IMPLANT, BILATERAL Bilateral   . CHOLECYSTECTOMY OPEN    . DILATION AND CURETTAGE OF UTERUS  1060'Colon   "might have done one when I miscarried"  . ESOPHAGOGASTRODUODENOSCOPY (EGD) WITH ESOPHAGEAL DILATION  X 2?  . FRACTURE SURGERY    . ORIF HIP FRACTURE Left 10/2009  . TOE SURGERY Right 08/2010   "took some bones out of the 2 toes next to my big toe"  . TONSILLECTOMY    . TOTAL ABDOMINAL HYSTERECTOMY     w/BSO  . TUBAL LIGATION  1973  . VERTEBROPLASTY     T8    There were no vitals filed for this visit.      Subjective Assessment - 01/12/16 2122    Subjective Pt reports she has been told that she looks as if she is holding her head up better - "some people at church told me that I looked like I was doing better"   Patient is accompained by: Family member   Limitations House hold activities;Walking;Sitting;Standing   Diagnostic tests last xrays last year   Patient Stated Goals resolve the vertigo and improve balance   Currently in Pain? Yes   Pain Score --  rates pain "mild"   Pain Location Neck   Pain Orientation Right;Left;Posterior   Pain Descriptors / Indicators Sore   Pain Type Chronic pain   Pain Onset More than a month ago   Pain Frequency Intermittent                         [facility] Adult PT Treatment/Exercise - 01/12/16 0001      Ambulation/Gait   Ambulation/Gait Yes   Ambulation/Gait Assistance 4: Min guard   Ambulation Distance (Feet) 240 Feet   Assistive device Rolling  walker   Gait Pattern Decreased step length - right;Decreased step length - left;Decreased hip/knee flexion - right;Decreased hip/knee flexion - left;Decreased dorsiflexion - right;Decreased dorsiflexion - left;Decreased weight shift to right;Decreased weight shift to left;Trunk flexed   Ambulation Surface Level;Indoor      NeuroRe-ed; x1 viewing exercise in seated position - 30 secs horizontal and 30 secs vertical  Standing balance exercises - forward, back and side kicks x 10 reps each; marching in place 10 reps each Sidestepping inside bars 10' x 2 reps with UE support prn Sit to stand x 5 reps with UE support prn with CGA           PT Education - 01/12/16 2128    Education provided Yes   Education Details x1 viewing exercise in seated position   Person(Colon) Educated Patient;Spouse   Methods Explanation;Demonstration;Handout   Comprehension Verbalized understanding;Returned demonstration          PT Short Term Goals -  01/01/16 1319      PT SHORT TERM GOAL #1   Title She will be independent with intial HEP   Status Achieved     PT SHORT TERM GOAL #2   Title She wll report 25% decreaased soreness and fatigue   Status On-going           PT Long Term Goals - 12/23/15 2156      PT LONG TERM GOAL #1   Title Pt will report at least 50% improvement in vertigo.  (01-20-16)   Time 4   Period Weeks   Status New     PT LONG TERM GOAL #2   Title Amb. 125' with RW with Colon on flat, even surface.  (01-20-16)   Time 4   Period Weeks   Status New     PT LONG TERM GOAL #3   Title Independent in HEP for balance/vestibular exercises.  (01-20-16)   Time 4   Period Weeks   Status New     PT LONG TERM GOAL #4   Title Improve TUG score by at least 5 secs to demo improved functional mobility.  (01-20-16)   Time 4   Period Weeks   Status New               Plan - 01/12/16 2130    Clinical Impression Statement Pt'Colon symptoms of "dizziness" are more consistent with central vertigo etiology rather than peripheral vertigo: pt appears to have dysequilibrium and significant fear of falling which impacts mobility; pt needs RW to maximize safety with mobility and to decrease fall risk   Rehab Potential Good   PT Frequency 1x / week   PT Duration 4 weeks   PT Treatment/Interventions ADLs/Self Care Home Management;Therapeutic activities;Stair training;Gait training;DME Instruction;Therapeutic exercise;Balance training;Patient/family education;Vestibular;Passive range of motion   PT Next Visit Plan faxed script for RW to Dr. Clelia CroftShaw (he did not respond to Epic message requesting script for RW); cont balance and gait training   PT Home Exercise Plan balance exercises   Consulted and Agree with Plan of Care Patient   Family Member Consulted husband      Patient will benefit from skilled therapeutic intervention in order to improve the following deficits and impairments:  Dizziness, Decreased balance, Difficulty  walking, Decreased strength, Postural dysfunction, Decreased range of motion  Visit Diagnosis: Other abnormalities of gait and mobility  Dizziness and giddiness     Problem List Patient Active Problem List   Diagnosis Date Noted  . Dyspnea on exertion   .  Other fatigue   . Palpitations   . Fatigue 12/26/2014  . Chest pain 12/26/2014  . Restrictive lung disease 01/05/2012  . GERD (gastroesophageal reflux disease) 01/05/2012  . Obstructive sleep apnea 01/05/2012  . Hypertension 01/05/2012    Kary Kos, PT 01/12/2016, 9:37 PM  [facility] [address] [place], [place], [address] Phone: [phone]   Fax:  [phone]  Name: LUBERTHA LEITE MRN: [id number] Date of Birth: 1933-02-07

## 2016-01-14 ENCOUNTER — Ambulatory Visit: Payer: Medicare Other | Admitting: Physical Therapy

## 2016-01-14 DIAGNOSIS — R42 Dizziness and giddiness: Secondary | ICD-10-CM

## 2016-01-14 DIAGNOSIS — M542 Cervicalgia: Secondary | ICD-10-CM | POA: Diagnosis not present

## 2016-01-14 DIAGNOSIS — R2689 Other abnormalities of gait and mobility: Secondary | ICD-10-CM

## 2016-01-14 NOTE — Therapy (Signed)
[facility] [address] [place], [place], [address] Phone: [phone]   Fax:  [phone]  Physical Therapy Treatment  Patient Details  Name: Kim Colon MRN: [id number] Date of Birth: 07-Dec-1932 Referring Provider: Martha ClanWilliam Shaw, MD  Encounter Date: 01/14/2016      PT End of Session - 01/14/16 2205    Visit Number 8  4th visit neuro   Date for PT Re-Evaluation 02/01/16   Authorization Type [employer]   Authorization Time Period 12-20-15 - 02-18-16   PT Start Time 1146   PT Stop Time 1233   PT Time Calculation (min) 47 min      Past Medical History:  Diagnosis Date  . Arthritis   . Barrett esophagus   . Chronic back pain   . Chronic epigastric pain   . Colon polyps   . Daily headache   . Depression   . GERD (gastroesophageal reflux disease)   . Hip fracture (HCC)    left  . History of hiatal hernia   . HTN (hypertension)   . Hyperlipemia   . Kyphosis   . Osteoporosis with fracture    T12,T11, T10  . Pulmonary nodule 2006  . Restrictive lung disease    "because of the way my back is"  . Shingles   . Sleep apnea    "went thru study; found evidence of sleep apnea; never got a mask" (12/26/2014)  . Vitamin D deficiency disease     Past Surgical History:  Procedure Laterality Date  . APPENDECTOMY    . BUNIONECTOMY Right 08/2010  . CATARACT EXTRACTION W/ INTRAOCULAR LENS  IMPLANT, BILATERAL Bilateral   . CHOLECYSTECTOMY OPEN    . DILATION AND CURETTAGE OF UTERUS  1060's   "might have done one when I miscarried"  . ESOPHAGOGASTRODUODENOSCOPY (EGD) WITH ESOPHAGEAL DILATION  X 2?  . FRACTURE SURGERY    . ORIF HIP FRACTURE Left 10/2009  . TOE SURGERY Right 08/2010   "took some bones out of the 2 toes next to my big toe"  . TONSILLECTOMY    . TOTAL ABDOMINAL HYSTERECTOMY     w/BSO  . TUBAL LIGATION  1973  . VERTEBROPLASTY     T8    There were no vitals filed for this visit.      Subjective  Assessment - 01/14/16 2159    Subjective Pt states she does not use the wheelchair in her home - uses cane and holds onto objects due to fear of falling:  script obtained from Dr. Clelia CroftShaw for RW   Patient is accompained by: Family member   Patient Stated Goals resolve the vertigo and improve balance   Currently in Pain? No/denies                         [facility] Adult PT Treatment/Exercise - 01/14/16 1205      Ambulation/Gait   Ambulation/Gait Yes   Ambulation/Gait Assistance 5: Supervision   Ambulation/Gait Assistance Details cues to hold head up   Ambulation Distance (Feet) 240 Feet   Assistive device Rolling walker   Gait Pattern Decreased step length - right;Decreased step length - left;Decreased hip/knee flexion - right;Decreased hip/knee flexion - left;Decreased dorsiflexion - right;Decreased dorsiflexion - left;Decreased weight shift to right;Decreased weight shift to left;Trunk flexed   Ambulation Surface Level;Indoor     Exercises   Exercises Knee/Hip     Knee/Hip Exercises: Aerobic   Nustep level 3 x 4" with  UE's and LE's     Instructed pt in correct hand placement with sit to/from stand transfers with RW  Neuro re-ed;  x1 viewing exercise in seated position 30 secs 1st rep, then 1" 2nd rep  Performed x1 viewing exercise in standing with bil. UE support on RW with CGA for 30 secs   Standing unsupported static with CGA x 10 secs  Marching in place - with UE support prn with CGA x 10 reps each leg        PT Education - 01/14/16 2204    Education provided Yes   Education Details instructed in correct hand placement with sit to/from stand with use of RW: reviewed x1 viewing exercise   Person(s) Educated Patient;Spouse   Methods Explanation;Demonstration   Comprehension Verbalized understanding;Returned demonstration          PT Short Term Goals - 01/01/16 1319      PT SHORT TERM GOAL #1   Title She will be independent with intial HEP   Status  Achieved     PT SHORT TERM GOAL #2   Title She wll report 25% decreaased soreness and fatigue   Status On-going           PT Long Term Goals - 12/23/15 2156      PT LONG TERM GOAL #1   Title Pt will report at least 50% improvement in vertigo.  (01-20-16)   Time 4   Period Weeks   Status New     PT LONG TERM GOAL #2   Title Amb. 125' with RW with S on flat, even surface.  (01-20-16)   Time 4   Period Weeks   Status New     PT LONG TERM GOAL #3   Title Independent in HEP for balance/vestibular exercises.  (01-20-16)   Time 4   Period Weeks   Status New     PT LONG TERM GOAL #4   Title Improve TUG score by at least 5 secs to demo improved functional mobility.  (01-20-16)   Time 4   Period Weeks   Status New               Plan - 01/14/16 2207    Clinical Impression Statement Pt is able to ambulate safely with RW with decreased risk of falling: instructed pt to use RW to amb. into clinic rather than use wheelchair.  Pt continues to c/o "dizziness" with head turns - appears to be dysequilibrium of central etiology   Rehab Potential Good   PT Frequency 1x / week   PT Duration 4 weeks   PT Treatment/Interventions ADLs/Self Care Home Management;Therapeutic activities;Stair training;Gait training;DME Instruction;Therapeutic exercise;Balance training;Patient/family education;Vestibular;Passive range of motion   PT Next Visit Plan check LTG's and renew for balance/gait training   PT Home Exercise Plan balance exercises   Consulted and Agree with Plan of Care Patient   Family Member Consulted husband      Patient will benefit from skilled therapeutic intervention in order to improve the following deficits and impairments:  Dizziness, Decreased balance, Difficulty walking, Decreased strength, Postural dysfunction, Decreased range of motion  Visit Diagnosis: Other abnormalities of gait and mobility  Dizziness and giddiness     Problem List Patient Active Problem  List   Diagnosis Date Noted  . Dyspnea on exertion   . Other fatigue   . Palpitations   . Fatigue 12/26/2014  . Chest pain 12/26/2014  . Restrictive lung disease 01/05/2012  . GERD (gastroesophageal reflux  disease) 01/05/2012  . Obstructive sleep apnea 01/05/2012  . Hypertension 01/05/2012    Kary Kos, PT 01/14/2016, 10:13 PM  [place] [address] [place], [place], [address] Phone: [phone]   Fax:  [phone]  Name: Kim Colon MRN: [id number] Date of Birth: [birth year]

## 2016-01-15 ENCOUNTER — Ambulatory Visit: Payer: Medicare Other

## 2016-01-15 DIAGNOSIS — R2689 Other abnormalities of gait and mobility: Secondary | ICD-10-CM

## 2016-01-15 DIAGNOSIS — M542 Cervicalgia: Secondary | ICD-10-CM | POA: Diagnosis not present

## 2016-01-15 DIAGNOSIS — R293 Abnormal posture: Secondary | ICD-10-CM

## 2016-01-15 DIAGNOSIS — M6281 Muscle weakness (generalized): Secondary | ICD-10-CM

## 2016-01-15 NOTE — Therapy (Signed)
[facility] [address] [place], [place], [address] Phone: [phone]   Fax:  [phone]  Physical Therapy Treatment  Patient Details  Name: Kim Colon MRN: [id number] Date of Birth: 03-May-1933 Referring Provider: Martha ClanWilliam Shaw, MD  Encounter Date: 01/15/2016      PT End of Session - 01/15/16 1244    Visit Number 9  visit 5 for ortho   Number of Visits 12   Date for PT Re-Evaluation 02/01/16   Authorization Time Period 12-20-15 - 02-18-16   PT Start Time 1230   PT Stop Time 1315   PT Time Calculation (min) 45 min   Activity Tolerance Patient tolerated treatment well   Behavior During Therapy [facility] for tasks assessed/performed      Past Medical History:  Diagnosis Date  . Arthritis   . Barrett esophagus   . Chronic back pain   . Chronic epigastric pain   . Colon polyps   . Daily headache   . Depression   . GERD (gastroesophageal reflux disease)   . Hip fracture (HCC)    left  . History of hiatal hernia   . HTN (hypertension)   . Hyperlipemia   . Kyphosis   . Osteoporosis with fracture    T12,T11, T10  . Pulmonary nodule 2006  . Restrictive lung disease    "because of the way my back is"  . Shingles   . Sleep apnea    "went thru study; found evidence of sleep apnea; never got a mask" (12/26/2014)  . Vitamin D deficiency disease     Past Surgical History:  Procedure Laterality Date  . APPENDECTOMY    . BUNIONECTOMY Right 08/2010  . CATARACT EXTRACTION W/ INTRAOCULAR LENS  IMPLANT, BILATERAL Bilateral   . CHOLECYSTECTOMY OPEN    . DILATION AND CURETTAGE OF UTERUS  1060's   "might have done one when I miscarried"  . ESOPHAGOGASTRODUODENOSCOPY (EGD) WITH ESOPHAGEAL DILATION  X 2?  . FRACTURE SURGERY    . ORIF HIP FRACTURE Left 10/2009  . TOE SURGERY Right 08/2010   "took some bones out of the 2 toes next to my big toe"  . TONSILLECTOMY    . TOTAL ABDOMINAL HYSTERECTOMY     w/BSO  . TUBAL LIGATION  1973   . VERTEBROPLASTY     T8    There were no vitals filed for this visit.      Subjective Assessment - 01/15/16 1234    Subjective Using RW now . Recieved yesterday. Lynnae SandhoffSusanne asks me to hold my head up and I try my best. All around neck feel stronger. Fatigue limits holding head up   Patient is accompained by: Family member   Currently in Pain? No/denies                         [facility] Treatment/Exercise - 01/15/16 0001      Neck Exercises: Theraband   Horizontal ABduction 10 reps;Red  2 sets with cues for posture.    Other Theraband Exercises ER shoulders red band 2x10 reps with cues for posture and abduction hands  behind back x 10 2 sets with red band     Neck Exercises: Standing   Other Standing Exercises back to wall and worked on stabilizing pelvis then stab neck and moving opposite end without moving none moving end. She hal difficulty keeping opposiut end stable but made good effort     Neck Exercises: Supine  Neck Retraction 15 reps  cue for not ext neck   Capital Flexion 10 reps   Other Supine Exercise --   Other Supine Exercise decompression exer x 5 RT and Lt Le and neck/sho                PT Education - 01/15/16 1330    Education provided Yes   Education Details wall exercise with stab one end of spine and move opposite end.    Person(s) Educated Patient;Spouse   Methods Explanation;Demonstration;Verbal cues;Handout;Tactile cues   Comprehension Returned demonstration;Verbalized understanding          PT Short Term Goals - 01/01/16 1319      PT SHORT TERM GOAL #1   Title She will be independent with intial HEP   Status Achieved     PT SHORT TERM GOAL #2   Title She wll report 25% decreaased soreness and fatigue   Status On-going           PT Long Term Goals - 01/15/16 1344      PT LONG TERM GOAL #5   Title She and her husband will be independent in all HEP ortho exercises   Time 1   Period Weeks   Status  On-going               Plan - 01/15/16 1245    Clinical Impression Statement No neck pain today  She feel less pain overall . Continues with flexed posture that appears structural  as with wall exercise she is not really able to disassociate her upper spine fro lower spine and pelvis due to stiffness.    PT Treatment/Interventions ADLs/Self Care Home Management;Therapeutic activities;Stair training;Gait training;DME Instruction;Therapeutic exercise;Balance training;Patient/family education;Vestibular;Passive range of motion   PT Next Visit Plan Continue x 1 more then discharge next week     FOTO   PT Home Exercise Plan wall stretching for erect posture   Consulted and Agree with Plan of Care Patient   Family Member Consulted husband      Patient will benefit from skilled therapeutic intervention in order to improve the following deficits and impairments:  Dizziness, Decreased balance, Difficulty walking, Decreased strength, Postural dysfunction, Decreased range of motion  Visit Diagnosis: Other abnormalities of gait and mobility  Muscle weakness (generalized)  Abnormal posture  Cervicalgia     Problem List Patient Active Problem List   Diagnosis Date Noted  . Dyspnea on exertion   . Other fatigue   . Palpitations   . Fatigue 12/26/2014  . Chest pain 12/26/2014  . Restrictive lung disease 01/05/2012  . GERD (gastroesophageal reflux disease) 01/05/2012  . Obstructive sleep apnea 01/05/2012  . Hypertension 01/05/2012    Caprice RedChasse, Channelle Bottger M  PT 01/15/2016, 1:48 PM  [facility] [address] [place], [place], [address] Phone: [phone]   Fax:  [phone]  Name: Kim Colon MRN: [id number] Date of Birth: 1933/02/19

## 2016-01-15 NOTE — Patient Instructions (Addendum)
Issued a program for back on wall with stab one end of spine and moving opposite ends of spine for strength and endurance 1-2x/day nad hold for 10-15 sec and 10-15 reps with walker in front and husband close supervision in front of walker. Reviewed with husband  Also

## 2016-01-21 ENCOUNTER — Ambulatory Visit: Payer: Medicare Other | Admitting: Physical Therapy

## 2016-01-21 DIAGNOSIS — R42 Dizziness and giddiness: Secondary | ICD-10-CM

## 2016-01-21 DIAGNOSIS — M542 Cervicalgia: Secondary | ICD-10-CM | POA: Diagnosis not present

## 2016-01-21 DIAGNOSIS — R2689 Other abnormalities of gait and mobility: Secondary | ICD-10-CM

## 2016-01-22 ENCOUNTER — Ambulatory Visit: Payer: Medicare Other

## 2016-01-22 DIAGNOSIS — M542 Cervicalgia: Secondary | ICD-10-CM

## 2016-01-22 DIAGNOSIS — M6281 Muscle weakness (generalized): Secondary | ICD-10-CM

## 2016-01-22 DIAGNOSIS — R293 Abnormal posture: Secondary | ICD-10-CM

## 2016-01-22 NOTE — Patient Instructions (Signed)
Arm openings from Pilates booklet RT and Lt 5-10 reps 1-2x/day Cautioned to use pain as guide to modify or stop this and all HEP

## 2016-01-22 NOTE — Therapy (Signed)
[place], [place], [address] Phone: [phone]   Fax:  [phone]  Physical Therapy Treatment/Ortho Discharge  Patient Details  Name: Kim Colon MRN: [id number] Date of Birth: [birth year] Referring Provider: Marton Redwood, MD  Encounter Date: 01/22/2016      PT End of Session - 01/22/16 1316    Visit Number 10  6 for ortho   Number of Visits 12   Date for PT Re-Evaluation 02/01/16   PT Start Time 1227   PT Stop Time 1314   PT Time Calculation (min) 47 min   Activity Tolerance Patient tolerated treatment well   Behavior During Therapy [facility] for tasks assessed/performed      Past Medical History:  Diagnosis Date  . Arthritis   . Barrett esophagus   . Chronic back pain   . Chronic epigastric pain   . Colon polyps   . Daily headache   . Depression   . GERD (gastroesophageal reflux disease)   . Hip fracture ([place])    left  . History of hiatal hernia   . HTN (hypertension)   . Hyperlipemia   . Kyphosis   . Osteoporosis with fracture    T12,T11, T10  . Pulmonary nodule 2006  . Restrictive lung disease    "because of the way my back is"  . Shingles   . Sleep apnea    "went thru study; found evidence of sleep apnea; never got a mask" (12/26/2014)  . Vitamin D deficiency disease     Past Surgical History:  Procedure Laterality Date  . APPENDECTOMY    . BUNIONECTOMY Right 08/2010  . CATARACT EXTRACTION W/ INTRAOCULAR LENS  IMPLANT, BILATERAL Bilateral   . CHOLECYSTECTOMY OPEN    . DILATION AND CURETTAGE OF UTERUS  1060's   "might have done one when I miscarried"  . ESOPHAGOGASTRODUODENOSCOPY (EGD) WITH ESOPHAGEAL DILATION  X 2?  . FRACTURE SURGERY    . ORIF HIP FRACTURE Left 10/2009  . TOE SURGERY Right 08/2010   "took some bones out of the 2 toes next to my big toe"  . TONSILLECTOMY    . TOTAL ABDOMINAL HYSTERECTOMY     w/BSO  . TUBAL LIGATION  1973  . VERTEBROPLASTY     T8     There were no vitals filed for this visit.      Subjective Assessment - 01/22/16 1229    Subjective She reports neck still gets tired and sore at times when exerc and when don't     Patient is accompained by: Family member   Currently in Pain? No/denies   Multiple Pain Sites No                         [facility] Adult PT Treatment/Exercise - 01/22/16 1245      Neck Exercises: Seated   Postural Training push up with arms and position body and neck in best possible posture and hold 10 sec x 6 , green band hor abduction and er x 10-12 reps      Neck Exercises: Supine   Other Supine Exercise decompression exer x 5 RT and Lt Le and neck/shoulder retraction, then clams x 15 to end ranges and then bridge x 12, then arsm over head single and dbl with hip flexed and extended      Neck Exercises: Sidelying   Other Sidelying Exercise arm opening RT and LT 5-7 reps  PT Education - 2016/01/28 1315    Education provided Yes   Education Details arm openings   Person(s) Educated Patient;Spouse   Methods Explanation;Demonstration;Tactile cues;Verbal cues;Handout   Comprehension Returned demonstration;Verbalized understanding          PT Short Term Goals - January 28, 2016 1317      PT SHORT TERM GOAL #1   Title She will be independent with intial HEP   Status Achieved     PT SHORT TERM GOAL #2   Title She wll report 25% decreaased soreness and fatigue   Status Not Met           PT Long Term Goals - 2016-01-28 1317      PT LONG TERM GOAL #1   Title Pt will report at least 50% improvement in vertigo.  (01-20-16)   Status On-going     PT LONG TERM GOAL #2   Title Amb. 125' with RW with S on flat, even surface.  (01-20-16)   Status Achieved     PT LONG TERM GOAL #3   Title Independent in HEP for balance/vestibular exercises.  (01-20-16)   Status New     PT LONG TERM GOAL #5   Title She and her husband will be independent in all HEP ortho exercises    Status Achieved               Plan - January 28, 2016 1316    Clinical Impression Statement She does wll with HEP but no significant changes noted in pain and mobility by patient.    PT Treatment/Interventions ADLs/Self Care Home Management;Therapeutic activities;Stair training;Gait training;DME Instruction;Therapeutic exercise;Balance training;Patient/family education;Vestibular;Passive range of motion   PT Next Visit Plan discharge with HEP   Consulted and Agree with Plan of Care Patient   Family Member Consulted husband      Patient will benefit from skilled therapeutic intervention in order to improve the following deficits and impairments:  Dizziness, Decreased balance, Difficulty walking, Decreased strength, Postural dysfunction, Decreased range of motion  Visit Diagnosis: Muscle weakness (generalized)  Abnormal posture  Cervicalgia       G-Codes - 01-28-16 1319    Functional Assessment Tool Used clinical judgement   Functional Limitation Changing and maintaining body position   Changing and Maintaining Body Position Current Status (K2706) At least 60 percent but less than 80 percent impaired, limited or restricted   Changing and Maintaining Body Position Goal Status (C3762) At least 40 percent but less than 60 percent impaired, limited or restricted   Changing and Maintaining Body Position Discharge Status (G3151) At least 60 percent but less than 80 percent impaired, limited or restricted      Problem List Patient Active Problem List   Diagnosis Date Noted  . Dyspnea on exertion   . Other fatigue   . Palpitations   . Fatigue 12/26/2014  . Chest pain 12/26/2014  . Restrictive lung disease 01/05/2012  . GERD (gastroesophageal reflux disease) 01/05/2012  . Obstructive sleep apnea 01/05/2012  . Hypertension 01/05/2012    Darrel Hoover  PT 2016-01-28, 1:20 PM  [facility] [address] [place], [place],  [address] Phone: [phone]   Fax:  [phone]  Name: Kim Colon MRN: [id number] Date of Birth: Jun 16, 1932  PHYSICAL THERAPY DISCHARGE SUMMARY  Visits from Start of Care: 6  Current functional level related to goals / functional outcomes: See above   Remaining deficits: Continues with fatigue and pain in neck but intermitant  and flexed posture  Education / Equipment: HEP Plan: Patient agrees to discharge.  Patient goals were partially met. Patient is being discharged due to the patient's request.  ?????

## 2016-01-23 NOTE — Therapy (Signed)
[facility] [address] [place], [place], [address] Phone: [phone]   Fax:  [phone]  Physical Therapy Treatment  Patient Details  Name: Kim Colon MRN: [id number] Date of Birth: 09/26/1932 Referring Provider: Marton Redwood, MD  Encounter Date: 01/21/2016      PT End of Session - 01/23/16 1024    Visit Number 5  G1   Number of Visits 12   Date for PT Re-Evaluation 02/21/16   Authorization Type [employer] Medicare   Authorization Time Period 02-21-16 - 04-21-16   PT Start Time 1103   PT Stop Time 1146   PT Time Calculation (min) 43 min      Past Medical History:  Diagnosis Date  . Arthritis   . Barrett esophagus   . Chronic back pain   . Chronic epigastric pain   . Colon polyps   . Daily headache   . Depression   . GERD (gastroesophageal reflux disease)   . Hip fracture ([place])    left  . History of hiatal hernia   . HTN (hypertension)   . Hyperlipemia   . Kyphosis   . Osteoporosis with fracture    T12,T11, T10  . Pulmonary nodule 2006  . Restrictive lung disease    "because of the way my back is"  . Shingles   . Sleep apnea    "went thru study; found evidence of sleep apnea; never got a mask" (12/26/2014)  . Vitamin D deficiency disease     Past Surgical History:  Procedure Laterality Date  . APPENDECTOMY    . BUNIONECTOMY Right 08/2010  . CATARACT EXTRACTION W/ INTRAOCULAR LENS  IMPLANT, BILATERAL Bilateral   . CHOLECYSTECTOMY OPEN    . DILATION AND CURETTAGE OF UTERUS  1060's   "might have done one when I miscarried"  . ESOPHAGOGASTRODUODENOSCOPY (EGD) WITH ESOPHAGEAL DILATION  X 2?  . FRACTURE SURGERY    . ORIF HIP FRACTURE Left 10/2009  . TOE SURGERY Right 08/2010   "took some bones out of the 2 toes next to my big toe"  . TONSILLECTOMY    . TOTAL ABDOMINAL HYSTERECTOMY     w/BSO  . TUBAL LIGATION  1973  . VERTEBROPLASTY     T8    There were no vitals filed for this visit.       Subjective Assessment - 01/23/16 1008    Subjective Pt using RW to amb. into clinic (not using wheelchair today); still gets dizzy with quick turns   Patient is accompained by: Family member   Limitations House hold activities;Walking;Sitting;Standing   Patient Stated Goals resolve the vertigo and improve balance   Currently in Pain? No/denies                         [facility] Adult PT Treatment/Exercise - 01/23/16 0001      Ambulation/Gait   Ambulation/Gait Yes   Ambulation/Gait Assistance 5: Supervision   Ambulation/Gait Assistance Details cues to hold head up   Ambulation Distance (Feet) 240 Feet   Assistive device Rolling walker   Gait Pattern Decreased step length - right;Decreased step length - left;Decreased hip/knee flexion - right;Decreased hip/knee flexion - left;Decreased dorsiflexion - right;Decreased dorsiflexion - left;Decreased weight shift to right;Decreased weight shift to left;Trunk flexed   Ambulation Surface Level;Indoor   Gait Comments Needed +2 assist due to balance and she used wheelchair to leave clinic.      Standardized Balance Assessment   Standardized  Balance Assessment Timed Up and Go Test     Timed Up and Go Test   TUG Normal TUG   Normal TUG (seconds) 27.97     TherAct:  Practiced sit to stand with quick pivot turn to R and L sides x 5 reps each for habituation - SBA for safety - Transfers sit to stand from mat table           PT Education - 02/09/2016 1315    Education provided Yes   Education Details arm openings   Person(s) Educated Patient;Spouse   Methods Explanation;Demonstration;Tactile cues;Verbal cues;Handout   Comprehension Returned demonstration;Verbalized understanding          PT Short Term Goals - 02/09/16 1317      PT SHORT TERM GOAL #1   Title She will be independent with intial HEP   Status Achieved     PT SHORT TERM GOAL #2   Title She wll report 25% decreaased soreness and fatigue   Status Not Met            PT Long Term Goals - 01/23/16 1018      PT LONG TERM GOAL #1   Title Pt will report at least 50% improvement in vertigo.  (01-20-16)/  new target 02-21-16   Baseline pt states vertigo varies; is worse with quick turns   Status On-going     PT LONG TERM GOAL #2   Title Amb. 125' with RW with S on flat, even surface.  (01-20-16)   Baseline met 01-21-16   Status Achieved     PT LONG TERM GOAL #3   Title Independent in HEP for balance/vestibular exercises.  (01-20-16)   Baseline met 01-21-16   Status Achieved     PT LONG TERM GOAL #4   Title Improve TUG score by at least 5 secs to demo improved functional mobility.  (01-20-16)/ 02-21-16 new target date   Baseline baseline TUG 27.97 secs with RW on 01-21-16 -- LTG </= 22.97 secs with RW   Time 4   Period Weeks   Status On-going     PT LONG TERM GOAL #5   Title Report at least 25% improvement in vertigo with turning.  (02-21-16)   Time 4   Period Weeks   Status New               Plan - 01/23/16 1026    Clinical Impression Statement Pt is progressing with increasing mobility and slowly improving with reducing c/o dizziness/vertigo.  Pt reports vertigo varies in intensity and still increases with quick turns.  Pt has met 2 LTG's with remaining goals ongoing.     Rehab Potential Good   PT Frequency 1x / week   PT Duration 4 weeks   PT Treatment/Interventions ADLs/Self Care Home Management;Therapeutic activities;Stair training;Gait training;DME Instruction;Therapeutic exercise;Balance training;Patient/family education;Vestibular;Passive range of motion   PT Next Visit Plan cont with balance and gait training   Consulted and Agree with Plan of Care Patient   Family Member Consulted husband      Patient will benefit from skilled therapeutic intervention in order to improve the following deficits and impairments:  Dizziness, Decreased balance, Difficulty walking, Decreased strength, Postural dysfunction, Decreased range  of motion  Visit Diagnosis: Other abnormalities of gait and mobility - Plan: PT plan of care cert/re-cert  Dizziness and giddiness - Plan: PT plan of care cert/re-cert       G-Codes - 2016/02/09 1319    Functional Assessment Tool Used clinical  judgement   Functional Limitation Changing and maintaining body position   Changing and Maintaining Body Position Current Status [phone]) At least 60 percent but less than 80 percent impaired, limited or restricted   Changing and Maintaining Body Position Goal Status (T9774) At least 40 percent but less than 60 percent impaired, limited or restricted   Changing and Maintaining Body Position Discharge Status (F4239) At least 60 percent but less than 80 percent impaired, limited or restricted      Problem List Patient Active Problem List   Diagnosis Date Noted  . Dyspnea on exertion   . Other fatigue   . Palpitations   . Fatigue 12/26/2014  . Chest pain 12/26/2014  . Restrictive lung disease 01/05/2012  . GERD (gastroesophageal reflux disease) 01/05/2012  . Obstructive sleep apnea 01/05/2012  . Hypertension 01/05/2012    Alda Lea, PT 01/23/2016, 10:43 AM  [facility] [address] [place], [place], [address] Phone: [phone]   Fax:  [phone]  Name: Kim Colon MRN: [id number] Date of Birth: 11/15/1932

## 2016-01-28 ENCOUNTER — Other Ambulatory Visit: Payer: Self-pay | Admitting: Cardiovascular Disease

## 2016-01-29 ENCOUNTER — Ambulatory Visit: Payer: Medicare Other | Attending: Internal Medicine | Admitting: Physical Therapy

## 2016-01-29 DIAGNOSIS — R42 Dizziness and giddiness: Secondary | ICD-10-CM | POA: Diagnosis present

## 2016-01-29 DIAGNOSIS — R2689 Other abnormalities of gait and mobility: Secondary | ICD-10-CM | POA: Diagnosis present

## 2016-02-01 ENCOUNTER — Encounter: Payer: Self-pay | Admitting: Physical Therapy

## 2016-02-01 NOTE — Therapy (Signed)
[facility] [address] [place], [place], [address] Phone: [phone]   Fax:  [phone]  Physical Therapy Treatment  Patient Details  Name: Kim Colon MRN: [id number] Date of Birth: Dec 19, 1932 Referring Provider: Marton Redwood, MD  Encounter Date: 01/29/2016      PT End of Session - 02/01/16 1901    Visit Number 6  G2   Number of Visits 12   Date for PT Re-Evaluation 02/21/16   Authorization Type [employer] Medicare   Authorization Time Period 02-21-16 - 04-21-16   PT Start Time 1150   PT Stop Time 1234   PT Time Calculation (min) 44 min      Past Medical History:  Diagnosis Date  . Arthritis   . Barrett esophagus   . Chronic back pain   . Chronic epigastric pain   . Colon polyps   . Daily headache   . Depression   . GERD (gastroesophageal reflux disease)   . Hip fracture ([place])    left  . History of hiatal hernia   . HTN (hypertension)   . Hyperlipemia   . Kyphosis   . Osteoporosis with fracture    T12,T11, T10  . Pulmonary nodule 2006  . Restrictive lung disease    "because of the way my back is"  . Shingles   . Sleep apnea    "went thru study; found evidence of sleep apnea; never got a mask" (12/26/2014)  . Vitamin D deficiency disease     Past Surgical History:  Procedure Laterality Date  . APPENDECTOMY    . BUNIONECTOMY Right 08/2010  . CATARACT EXTRACTION W/ INTRAOCULAR LENS  IMPLANT, BILATERAL Bilateral   . CHOLECYSTECTOMY OPEN    . DILATION AND CURETTAGE OF UTERUS  1060's   "might have done one when I miscarried"  . ESOPHAGOGASTRODUODENOSCOPY (EGD) WITH ESOPHAGEAL DILATION  X 2?  . FRACTURE SURGERY    . ORIF HIP FRACTURE Left 10/2009  . TOE SURGERY Right 08/2010   "took some bones out of the 2 toes next to my big toe"  . TONSILLECTOMY    . TOTAL ABDOMINAL HYSTERECTOMY     w/BSO  . TUBAL LIGATION  1973  . VERTEBROPLASTY     T8    There were no vitals filed for this visit.       Subjective Assessment - 02/01/16 1855    Subjective Pt reports no major changes since visit last week   Patient is accompained by: Family member   Limitations House hold activities;Walking;Sitting;Standing   Diagnostic tests last xrays last year   Patient Stated Goals resolve the vertigo and improve balance   Currently in Pain? No/denies                         [facility] Adult PT Treatment/Exercise - 02/01/16 0001      Transfers   Transfers Sit to Stand;Stand to Sit   Number of Reps 10 reps     Ambulation/Gait   Ambulation/Gait Yes   Ambulation/Gait Assistance 5: Supervision   Ambulation/Gait Assistance Details cues to hold head up   Ambulation Distance (Feet) 150 Feet   Assistive device Rolling walker   Gait Pattern Decreased step length - right;Decreased step length - left;Decreased hip/knee flexion - right;Decreased hip/knee flexion - left;Decreased dorsiflexion - right;Decreased dorsiflexion - left;Decreased weight shift to right;Decreased weight shift to left;Trunk flexed   Ambulation Surface Level;Indoor     Knee/Hip Exercises: Aerobic  Recumbent Bike Nustep level 3 x 5" with UE's and LE's     Knee/Hip Exercises: Standing   Heel Raises Both;10 reps             Balance Exercises - 02/01/16 1859      Balance Exercises: Standing   Standing Eyes Opened Wide ([place]);Solid surface;2 reps;10 secs   Wall Bumps Hip   Rebounder Double leg;10 reps   Stepping Strategy Anterior;Lateral;UE support;10 reps   Sidestepping 2 reps  inside // bars             PT Short Term Goals - 01/22/16 1317      PT SHORT TERM GOAL #1   Title She will be independent with intial HEP   Status Achieved     PT SHORT TERM GOAL #2   Title She wll report 25% decreaased soreness and fatigue   Status Not Met           PT Long Term Goals - 01/23/16 1018      PT LONG TERM GOAL #1   Title Pt will report at least 50% improvement in vertigo.  (01-20-16)/  new target  02-21-16   Baseline pt states vertigo varies; is worse with quick turns   Status On-going     PT LONG TERM GOAL #2   Title Amb. 125' with RW with S on flat, even surface.  (01-20-16)   Baseline met 01-21-16   Status Achieved     PT LONG TERM GOAL #3   Title Independent in HEP for balance/vestibular exercises.  (01-20-16)   Baseline met 01-21-16   Status Achieved     PT LONG TERM GOAL #4   Title Improve TUG score by at least 5 secs to demo improved functional mobility.  (01-20-16)/ 02-21-16 new target date   Baseline baseline TUG 27.97 secs with RW on 01-21-16 -- LTG </= 22.97 secs with RW   Time 4   Period Weeks   Status On-going     PT LONG TERM GOAL #5   Title Report at least 25% improvement in vertigo with turning.  (02-21-16)   Time 4   Period Weeks   Status New               Plan - 02/01/16 1901    Clinical Impression Statement Pt continues to have difficulty holding head upright - this contributes to balance deficits as pt has difficulty looking straight ahead and with environmental scanning due to weak cervical musculature   Rehab Potential Good   PT Frequency 1x / week   PT Duration 4 weeks   PT Treatment/Interventions ADLs/Self Care Home Management;Therapeutic activities;Stair training;Gait training;DME Instruction;Therapeutic exercise;Balance training;Patient/family education;Vestibular;Passive range of motion   PT Next Visit Plan cont with balance and gait training   Consulted and Agree with Plan of Care Patient   Family Member Consulted husband      Patient will benefit from skilled therapeutic intervention in order to improve the following deficits and impairments:  Dizziness, Decreased balance, Difficulty walking, Decreased strength, Postural dysfunction, Decreased range of motion  Visit Diagnosis: Other abnormalities of gait and mobility  Dizziness and giddiness     Problem List Patient Active Problem List   Diagnosis Date Noted  . Dyspnea on  exertion   . Other fatigue   . Palpitations   . Fatigue 12/26/2014  . Chest pain 12/26/2014  . Restrictive lung disease 01/05/2012  . GERD (gastroesophageal reflux disease) 01/05/2012  . Obstructive sleep apnea 01/05/2012  .  Hypertension 01/05/2012    Alda Lea, PT 02/01/2016, 7:05 PM  [facility] [address] [place], [place], [address] Phone: [phone]   Fax:  [phone]  Name: Kim Colon MRN: [id number] Date of Birth: September 10, 1932

## 2016-02-05 ENCOUNTER — Ambulatory Visit: Payer: Medicare Other | Admitting: Physical Therapy

## 2016-02-12 ENCOUNTER — Ambulatory Visit: Payer: Medicare Other | Admitting: Physical Therapy

## 2016-02-12 DIAGNOSIS — R42 Dizziness and giddiness: Secondary | ICD-10-CM

## 2016-02-12 DIAGNOSIS — R2689 Other abnormalities of gait and mobility: Secondary | ICD-10-CM

## 2016-02-13 NOTE — Therapy (Signed)
[facility] [address] [place], [place], [address] Phone: [phone]   Fax:  [phone]  Physical Therapy Treatment  Patient Details  Name: Kim Colon MRN: [id number] Date of Birth: 07-12-1932 Referring Provider: Marton Redwood, MD  Encounter Date: 02/12/2016      PT End of Session - 02/13/16 0959    Visit Number 7  G3   Number of Visits 12   Date for PT Re-Evaluation 02/21/16   Authorization Type [employer] Medicare   Authorization Time Period 01-21-16 - 03-21-16   PT Start Time 1018   PT Stop Time 1100   PT Time Calculation (min) 42 min      Past Medical History:  Diagnosis Date  . Arthritis   . Barrett esophagus   . Chronic back pain   . Chronic epigastric pain   . Colon polyps   . Daily headache   . Depression   . GERD (gastroesophageal reflux disease)   . Hip fracture ([place])    left  . History of hiatal hernia   . HTN (hypertension)   . Hyperlipemia   . Kyphosis   . Osteoporosis with fracture    T12,T11, T10  . Pulmonary nodule 2006  . Restrictive lung disease    "because of the way my back is"  . Shingles   . Sleep apnea    "went thru study; found evidence of sleep apnea; never got a mask" (12/26/2014)  . Vitamin D deficiency disease     Past Surgical History:  Procedure Laterality Date  . APPENDECTOMY    . BUNIONECTOMY Right 08/2010  . CATARACT EXTRACTION W/ INTRAOCULAR LENS  IMPLANT, BILATERAL Bilateral   . CHOLECYSTECTOMY OPEN    . DILATION AND CURETTAGE OF UTERUS  1060's   "might have done one when I miscarried"  . ESOPHAGOGASTRODUODENOSCOPY (EGD) WITH ESOPHAGEAL DILATION  X 2?  . FRACTURE SURGERY    . ORIF HIP FRACTURE Left 10/2009  . TOE SURGERY Right 08/2010   "took some bones out of the 2 toes next to my big toe"  . TONSILLECTOMY    . TOTAL ABDOMINAL HYSTERECTOMY     w/BSO  . TUBAL LIGATION  1973  . VERTEBROPLASTY     T8    There were no vitals filed for this visit.       Subjective Assessment - 02/13/16 0949    Subjective Pt reports that she feels she is doing a little better but doesn't seem to be making any big changes in status - continues to have much difficulty with being able to hold her head up   Patient is accompained by: Family member   Limitations House hold activities;Walking;Sitting;Standing   Patient Stated Goals resolve the vertigo and improve balance   Currently in Pain? No/denies                         [facility] Adult PT Treatment/Exercise - 02/13/16 0001      Transfers   Transfers Sit to Stand;Stand to Sit   Number of Reps 10 reps   Comments UE support needed - bil. on 1st 5, 1 UE on 2nd 5 reps     Ambulation/Gait   Ambulation/Gait Yes   Ambulation/Gait Assistance 5: Supervision   Ambulation/Gait Assistance Details cues to hold head upright   Ambulation Distance (Feet) 120 Feet   Assistive device Rolling walker   Gait Pattern Decreased step length - right;Decreased step length - left;Decreased  hip/knee flexion - right;Decreased hip/knee flexion - left;Decreased dorsiflexion - right;Decreased dorsiflexion - left;Decreased weight shift to right;Decreased weight shift to left;Trunk flexed   Ambulation Surface Level;Indoor   Gait velocity 24.65 secs= 1.33 ft/sec  with RW     Berg Balance Test   Sit to Stand Able to stand  independently using hands   Standing Unsupported Able to stand 30 seconds unsupported   Sitting with Back Unsupported but Feet Supported on Floor or Stool Able to sit safely and securely 2 minutes   Stand to Sit Uses backs of legs against chair to control descent   Transfers Able to transfer safely, definite need of hands   Standing Unsupported with Eyes Closed Able to stand 10 seconds with supervision   Standing Ubsupported with Feet Together Able to place feet together independently but unable to hold for 30 seconds   From Standing, Reach Forward with Outstretched Arm Can reach forward >12 cm safely  (5")   From Standing Position, Pick up Object from Floor Unable to try/needs assist to keep balance   From Standing Position, Turn to Look Behind Over each Shoulder Needs supervision when turning   Turn 360 Degrees Needs close supervision or verbal cueing   Standing Unsupported, Alternately Place Feet on Step/Stool Able to complete >2 steps/needs minimal assist   Standing Unsupported, One Foot in Front Able to take small step independently and hold 30 seconds   Standing on One Leg Unable to try or needs assist to prevent fall   Total Score 27     Timed Up and Go Test   TUG Normal TUG   Normal TUG (seconds) 23.41     Self Care; discussed benefit from use of smaller soft cervical collar - pt has a 4" which she says is uncomfortable and only Wears it while riding in the car; recommend use of 2" collar to assist in holding head upright to increase visual input in main- taining balance - pt agrees with recommendation           PT Education - 02/13/16 0958    Education provided Yes   Education Details Reviewed LTG's and progress; reviewed/emphasized importance of balance HEP - reviewed these exercises   Person(s) Educated Patient;Spouse   Methods Explanation;Demonstration   Comprehension Verbalized understanding          PT Short Term Goals - 01/22/16 1317      PT SHORT TERM GOAL #1   Title She will be independent with intial HEP   Status Achieved     PT SHORT TERM GOAL #2   Title She wll report 25% decreaased soreness and fatigue   Status Not Met           PT Long Term Goals - 02/13/16 1005      PT LONG TERM GOAL #1   Title Pt will report at least 50% improvement in vertigo.  (01-20-16)/  new target 02-21-16   Status On-going     PT LONG TERM GOAL #2   Title Amb. 125' with RW with S on flat, even surface.  (01-20-16)   Status Achieved     PT LONG TERM GOAL #3   Title Independent in HEP for balance/vestibular exercises.  (01-20-16)   Status Achieved     PT LONG  TERM GOAL #4   Title Improve TUG score by at least 5 secs to demo improved functional mobility.  (01-20-16)/ 02-21-16 new target date   Baseline 23.41 secs with RW on 02-12-16;  27 secs initially     PT LONG TERM GOAL #5   Title Report at least 25% improvement in vertigo with turning.  (02-21-16)   Status On-going     PT LONG TERM GOAL #6   Baseline 24.65 secs  gait velocity               Plan - 02/13/16 1002    Clinical Impression Statement Pt is making some progress towards LTG's - reports some slight decrease in vertigo overall; TUG score has improved from 27 secs to 23.41 secs with use of RW; pt may benefit from 2" soft cervical collar to assist in holding head upright to increase visual input in balance   Rehab Potential Good   PT Frequency 1x / week   PT Duration 4 weeks   PT Treatment/Interventions ADLs/Self Care Home Management;Therapeutic activities;Stair training;Gait training;DME Instruction;Therapeutic exercise;Balance training;Patient/family education;Vestibular;Passive range of motion   PT Next Visit Plan cont with balance and gait training   PT Home Exercise Plan wall stretching for erect posture   Consulted and Agree with Plan of Care Patient   Family Member Consulted husband      Patient will benefit from skilled therapeutic intervention in order to improve the following deficits and impairments:  Dizziness, Decreased balance, Difficulty walking, Decreased strength, Postural dysfunction, Decreased range of motion  Visit Diagnosis: Other abnormalities of gait and mobility  Dizziness and giddiness     Problem List Patient Active Problem List   Diagnosis Date Noted  . Dyspnea on exertion   . Other fatigue   . Palpitations   . Fatigue 12/26/2014  . Chest pain 12/26/2014  . Restrictive lung disease 01/05/2012  . GERD (gastroesophageal reflux disease) 01/05/2012  . Obstructive sleep apnea 01/05/2012  . Hypertension 01/05/2012    Alda Lea,  PT 02/13/2016, 10:08 AM  [facility] [address] [place], [place], [address] Phone: [phone]   Fax:  [phone]  Name: Kim Colon MRN: [id number] Date of Birth: 12-12-1932

## 2016-02-19 ENCOUNTER — Ambulatory Visit: Payer: Medicare Other | Admitting: Physical Therapy

## 2016-02-19 DIAGNOSIS — R2689 Other abnormalities of gait and mobility: Secondary | ICD-10-CM | POA: Diagnosis not present

## 2016-02-19 DIAGNOSIS — R42 Dizziness and giddiness: Secondary | ICD-10-CM

## 2016-02-21 NOTE — Therapy (Signed)
[facility] [address] [place], [place], [address] Phone: [phone]   Fax:  [phone]  Physical Therapy Treatment  Patient Details  Name: Kim Colon MRN: [id number] Date of Birth: 1932/11/20 Referring Provider: Marton Redwood, MD  Encounter Date: 02/19/2016      PT End of Session - 02/21/16 1054    Visit Number 8   Number of Visits 12   Date for PT Re-Evaluation 02/21/16   Authorization Type [employer] Medicare   Authorization Time Period 01-21-16 - 03-21-16   PT Start Time 1016   PT Stop Time 1100   PT Time Calculation (min) 44 min      Past Medical History:  Diagnosis Date  . Arthritis   . Barrett esophagus   . Chronic back pain   . Chronic epigastric pain   . Colon polyps   . Daily headache   . Depression   . GERD (gastroesophageal reflux disease)   . Hip fracture ([place])    left  . History of hiatal hernia   . HTN (hypertension)   . Hyperlipemia   . Kyphosis   . Osteoporosis with fracture    T12,T11, T10  . Pulmonary nodule 2006  . Restrictive lung disease    "because of the way my back is"  . Shingles   . Sleep apnea    "went thru study; found evidence of sleep apnea; never got a mask" (12/26/2014)  . Vitamin D deficiency disease     Past Surgical History:  Procedure Laterality Date  . APPENDECTOMY    . BUNIONECTOMY Right 08/2010  . CATARACT EXTRACTION W/ INTRAOCULAR LENS  IMPLANT, BILATERAL Bilateral   . CHOLECYSTECTOMY OPEN    . DILATION AND CURETTAGE OF UTERUS  1060's   "might have done one when I miscarried"  . ESOPHAGOGASTRODUODENOSCOPY (EGD) WITH ESOPHAGEAL DILATION  X 2?  . FRACTURE SURGERY    . ORIF HIP FRACTURE Left 10/2009  . TOE SURGERY Right 08/2010   "took some bones out of the 2 toes next to my big toe"  . TONSILLECTOMY    . TOTAL ABDOMINAL HYSTERECTOMY     w/BSO  . TUBAL LIGATION  1973  . VERTEBROPLASTY     T8    There were no vitals filed for this visit.       Subjective Assessment - 02/21/16 1051    Subjective Pt reports no changes or problems since visit last week - cont to report that her neck gets really tired - is hard to hold up   Patient is accompained by: Family member   Limitations House hold activities;Walking;Sitting;Standing   Diagnostic tests last xrays last year   Patient Stated Goals resolve the vertigo and improve balance   Currently in Pain? No/denies                         [facility] Adult PT Treatment/Exercise - 02/21/16 0001      Transfers   Transfers Sit to Stand;Stand to Sit   Number of Reps 10 reps   Comments RUE support used minimally     Ambulation/Gait   Ambulation/Gait Yes   Ambulation/Gait Assistance 5: Supervision   Ambulation/Gait Assistance Details cues to hold head upright   Ambulation Distance (Feet) 240 Feet   Assistive device Rolling walker   Gait Pattern Decreased step length - right;Decreased step length - left;Decreased hip/knee flexion - right;Decreased hip/knee flexion - left;Decreased dorsiflexion - right;Decreased dorsiflexion - left;Decreased  weight shift to right;Decreased weight shift to left;Trunk flexed   Ambulation Surface Level;Indoor             Balance Exercises - 02/21/16 1053      Balance Exercises: Standing   Standing Eyes Opened Wide (BOA);Solid surface;10 secs   Standing Eyes Closed Wide (BOA);Solid surface;2 reps;10 secs   Stepping Strategy Anterior;Lateral;10 reps  with UE support prn   Rockerboard Anterior/posterior;Lateral;UE support   Sidestepping 2 reps  inside bars   Other Standing Exercises stepping over and back of balance beam             PT Short Term Goals - 01/22/16 1317      PT SHORT TERM GOAL #1   Title She will be independent with intial HEP   Status Achieved     PT SHORT TERM GOAL #2   Title She wll report 25% decreaased soreness and fatigue   Status Not Met           PT Long Term Goals - 02/13/16 1005      PT LONG  TERM GOAL #1   Title Pt will report at least 50% improvement in vertigo.  (01-20-16)/  new target 02-21-16   Status On-going     PT LONG TERM GOAL #2   Title Amb. 125' with RW with S on flat, even surface.  (01-20-16)   Status Achieved     PT LONG TERM GOAL #3   Title Independent in HEP for balance/vestibular exercises.  (01-20-16)   Status Achieved     PT LONG TERM GOAL #4   Title Improve TUG score by at least 5 secs to demo improved functional mobility.  (01-20-16)/ 02-21-16 new target date   Baseline 23.41 secs with RW on 02-12-16;  27 secs initially     PT LONG TERM GOAL #5   Title Report at least 25% improvement in vertigo with turning.  (02-21-16)   Status On-going     PT LONG TERM GOAL #6   Baseline 24.65 secs  gait velocity               Plan - 02/21/16 1055    Clinical Impression Statement Pt is making slow progress towards goals;  2 1/2" collar was obtained and trialed, however, pt reports that this height is still uncomfortable for her; will try a 2" to assist in holding head upright so that visual input is increased with standing balance   Rehab Potential Good   PT Frequency 1x / week   PT Duration 4 weeks   PT Treatment/Interventions ADLs/Self Care Home Management;Therapeutic activities;Stair training;Gait training;DME Instruction;Therapeutic exercise;Balance training;Patient/family education;Vestibular;Passive range of motion   PT Next Visit Plan cont with balance and gait training   PT Home Exercise Plan balance exercises   Consulted and Agree with Plan of Care Patient   Family Member Consulted husband      Patient will benefit from skilled therapeutic intervention in order to improve the following deficits and impairments:  Dizziness, Decreased balance, Difficulty walking, Decreased strength, Postural dysfunction, Decreased range of motion  Visit Diagnosis: Other abnormalities of gait and mobility  Dizziness and giddiness     Problem List Patient Active  Problem List   Diagnosis Date Noted  . Dyspnea on exertion   . Other fatigue   . Palpitations   . Fatigue 12/26/2014  . Chest pain 12/26/2014  . Restrictive lung disease 01/05/2012  . GERD (gastroesophageal reflux disease) 01/05/2012  . Obstructive sleep apnea 01/05/2012  .  Hypertension 01/05/2012    Alda Lea, PT 02/21/2016, 10:59 AM  [facility] [address] [place], [place], [address] Phone: [phone]   Fax:  [phone]  Name: LATESE DUFAULT MRN: [id number] Date of Birth: 20-Jul-1932

## 2016-03-04 ENCOUNTER — Ambulatory Visit: Payer: Medicare Other | Attending: Internal Medicine | Admitting: Physical Therapy

## 2016-03-04 DIAGNOSIS — M6281 Muscle weakness (generalized): Secondary | ICD-10-CM | POA: Diagnosis present

## 2016-03-04 DIAGNOSIS — R2689 Other abnormalities of gait and mobility: Secondary | ICD-10-CM | POA: Diagnosis present

## 2016-03-05 NOTE — Therapy (Signed)
[facility] [address] [place], [place], [address] Phone: [phone]   Fax:  [phone]  Physical Therapy Treatment  Patient Details  Name: Kim Colon MRN: [id number] Date of Birth: 22-May-1933 Referring Provider: Marton Redwood, MD  Encounter Date: 03/04/2016      PT End of Session - 03/05/16 0742    Visit Number 9   Number of Visits 12   Date for PT Re-Evaluation 03/21/16   Authorization Type [employer] Medicare   Authorization Time Period 01-21-16 - 03-21-16   PT Start Time 1148   PT Stop Time 1233   PT Time Calculation (min) 45 min   Equipment Utilized During Treatment Gait belt      Past Medical History:  Diagnosis Date  . Arthritis   . Barrett esophagus   . Chronic back pain   . Chronic epigastric pain   . Colon polyps   . Daily headache   . Depression   . GERD (gastroesophageal reflux disease)   . Hip fracture ([place])    left  . History of hiatal hernia   . HTN (hypertension)   . Hyperlipemia   . Kyphosis   . Osteoporosis with fracture    T12,T11, T10  . Pulmonary nodule 2006  . Restrictive lung disease    "because of the way my back is"  . Shingles   . Sleep apnea    "went thru study; found evidence of sleep apnea; never got a mask" (12/26/2014)  . Vitamin D deficiency disease     Past Surgical History:  Procedure Laterality Date  . APPENDECTOMY    . BUNIONECTOMY Right 08/2010  . CATARACT EXTRACTION W/ INTRAOCULAR LENS  IMPLANT, BILATERAL Bilateral   . CHOLECYSTECTOMY OPEN    . DILATION AND CURETTAGE OF UTERUS  1060's   "might have done one when I miscarried"  . ESOPHAGOGASTRODUODENOSCOPY (EGD) WITH ESOPHAGEAL DILATION  X 2?  . FRACTURE SURGERY    . ORIF HIP FRACTURE Left 10/2009  . TOE SURGERY Right 08/2010   "took some bones out of the 2 toes next to my big toe"  . TONSILLECTOMY    . TOTAL ABDOMINAL HYSTERECTOMY     w/BSO  . TUBAL LIGATION  1973  . VERTEBROPLASTY     T8    There  were no vitals filed for this visit.      Subjective Assessment - 03/05/16 0740    Subjective Pt reports no significant changes - doesn't feel she is making alot of progress - states "my head still feels funny"   Patient is accompained by: Family member   Limitations House hold activities;Walking;Sitting;Standing   Diagnostic tests last xrays last year   Patient Stated Goals resolve the vertigo and improve balance   Currently in Pain? No/denies                         [facility] Adult PT Treatment/Exercise - 03/05/16 0001      Ambulation/Gait   Ambulation/Gait Yes   Ambulation/Gait Assistance 4: Min assist   Ambulation/Gait Assistance Details cues to hold head upright   Ambulation Distance (Feet) 240 Feet   Assistive device 1 person hand held assist   Gait Pattern Decreased step length - right;Decreased step length - left;Decreased hip/knee flexion - right;Decreased hip/knee flexion - left;Decreased dorsiflexion - right;Decreased dorsiflexion - left;Decreased weight shift to right;Decreased weight shift to left;Trunk flexed   Ambulation Surface Level;Indoor  NeuroRe-ed; single limb stance activities - stepping over and back of orange hurdle 10 reps each leg with UE support Rockerboard 10 reps anterior/posterior Sit to stand on blue Airex 10 reps with UE support prn  Touching balance bubbles for targets with UE support prn with CGA Cone taps (3) with RLE and then with LLE with UE support prn for improved SLS  Alternate tap ups to 4" step 5 reps each leg with UE support with CGA         PT Short Term Goals - 01/22/16 1317      PT SHORT TERM GOAL #1   Title She will be independent with intial HEP   Status Achieved     PT SHORT TERM GOAL #2   Title She wll report 25% decreaased soreness and fatigue   Status Not Met           PT Long Term Goals - 02/13/16 1005      PT LONG TERM GOAL #1   Title Pt will report at least 50% improvement in vertigo.   (01-20-16)/  new target 02-21-16   Status On-going     PT LONG TERM GOAL #2   Title Amb. 125' with RW with S on flat, even surface.  (01-20-16)   Status Achieved     PT LONG TERM GOAL #3   Title Independent in HEP for balance/vestibular exercises.  (01-20-16)   Status Achieved     PT LONG TERM GOAL #4   Title Improve TUG score by at least 5 secs to demo improved functional mobility.  (01-20-16)/ 02-21-16 new target date   Baseline 23.41 secs with RW on 02-12-16;  27 secs initially     PT LONG TERM GOAL #5   Title Report at least 25% improvement in vertigo with turning.  (02-21-16)   Status On-going     PT LONG TERM GOAL #6   Baseline 24.65 secs  gait velocity               Plan - 03/05/16 0742    Clinical Impression Statement Pt continues to have much difficulty maintaining cervical extension to hold head upright; will trial effectiveness of 2" cervical collar;  progressing slowly with improving balance   Rehab Potential Good   PT Frequency 1x / week   PT Duration 4 weeks   PT Treatment/Interventions ADLs/Self Care Home Management;Therapeutic activities;Stair training;Gait training;DME Instruction;Therapeutic exercise;Balance training;Patient/family education;Vestibular;Passive range of motion   PT Next Visit Plan cont with balance and gait training   PT Home Exercise Plan balance exercises   Consulted and Agree with Plan of Care Patient   Family Member Consulted husband      Patient will benefit from skilled therapeutic intervention in order to improve the following deficits and impairments:  Dizziness, Decreased balance, Difficulty walking, Decreased strength, Postural dysfunction, Decreased range of motion  Visit Diagnosis: Other abnormalities of gait and mobility     Problem List Patient Active Problem List   Diagnosis Date Noted  . Dyspnea on exertion   . Other fatigue   . Palpitations   . Fatigue 12/26/2014  . Chest pain 12/26/2014  . Restrictive lung disease  01/05/2012  . GERD (gastroesophageal reflux disease) 01/05/2012  . Obstructive sleep apnea 01/05/2012  . Hypertension 01/05/2012    Alda Lea, PT 03/05/2016, 7:46 AM  [facility] [address] [place], [place], [address] Phone: [phone]   Fax:  [phone]  Name: Kim Colon  MRN: [id number] Date of Birth: 01/21/1933

## 2016-03-11 ENCOUNTER — Ambulatory Visit: Payer: Medicare Other | Admitting: Physical Therapy

## 2016-03-11 DIAGNOSIS — R2689 Other abnormalities of gait and mobility: Secondary | ICD-10-CM

## 2016-03-11 DIAGNOSIS — M6281 Muscle weakness (generalized): Secondary | ICD-10-CM

## 2016-03-12 ENCOUNTER — Other Ambulatory Visit: Payer: Self-pay | Admitting: Cardiovascular Disease

## 2016-03-12 NOTE — Therapy (Signed)
[facility] [address] [place], [place], [address] Phone: [phone]   Fax:  [phone]  Physical Therapy Treatment  Patient Details  Name: Kim Colon MRN: [id number] Date of Birth: 1932-11-03 Referring Provider: Marton Redwood, MD  Encounter Date: 03/11/2016      PT End of Session - 03/12/16 1022    Visit Number 10  G6   Number of Visits 12   Date for PT Re-Evaluation 03/21/16   Authorization Type [employer] Medicare   Authorization Time Period 01-21-16 - 03-21-16   PT Start Time 1147   PT Stop Time 1233   PT Time Calculation (min) 46 min      Past Medical History:  Diagnosis Date  . Arthritis   . Barrett esophagus   . Chronic back pain   . Chronic epigastric pain   . Colon polyps   . Daily headache   . Depression   . GERD (gastroesophageal reflux disease)   . Hip fracture ([place])    left  . History of hiatal hernia   . HTN (hypertension)   . Hyperlipemia   . Kyphosis   . Osteoporosis with fracture    T12,T11, T10  . Pulmonary nodule 2006  . Restrictive lung disease    "because of the way my back is"  . Shingles   . Sleep apnea    "went thru study; found evidence of sleep apnea; never got a mask" (12/26/2014)  . Vitamin D deficiency disease     Past Surgical History:  Procedure Laterality Date  . APPENDECTOMY    . BUNIONECTOMY Right 08/2010  . CATARACT EXTRACTION W/ INTRAOCULAR LENS  IMPLANT, BILATERAL Bilateral   . CHOLECYSTECTOMY OPEN    . DILATION AND CURETTAGE OF UTERUS  1060's   "might have done one when I miscarried"  . ESOPHAGOGASTRODUODENOSCOPY (EGD) WITH ESOPHAGEAL DILATION  X 2?  . FRACTURE SURGERY    . ORIF HIP FRACTURE Left 10/2009  . TOE SURGERY Right 08/2010   "took some bones out of the 2 toes next to my big toe"  . TONSILLECTOMY    . TOTAL ABDOMINAL HYSTERECTOMY     w/BSO  . TUBAL LIGATION  1973  . VERTEBROPLASTY     T8    There were no vitals filed for this visit.       Subjective Assessment - 03/12/16 1005    Subjective Pt states she tries to do things at home - has difficulty holding her head up for long periods of time; tried wearing cervical collar when shelling peas the other night but it was uncomfortable and didn't seem to help much   Patient is accompained by: Family member   Limitations House hold activities;Walking;Sitting;Standing   Diagnostic tests last xrays last year   Patient Stated Goals resolve the vertigo and improve balance   Currently in Pain? No/denies                         [facility] Adult PT Treatment/Exercise - 03/12/16 0001      Transfers   Transfers Sit to Stand;Stand to Sit   Number of Reps 10 reps   Comments RUE support used minimally     Ambulation/Gait   Ambulation/Gait Yes   Ambulation/Gait Assistance 4: Min assist   Ambulation/Gait Assistance Details cues to increase step length and hold head up as tolerated   Ambulation Distance (Feet) 240 Feet   Assistive device 1 person hand held assist  Gait Pattern Decreased step length - right;Decreased step length - left;Decreased hip/knee flexion - right;Decreased hip/knee flexion - left;Decreased dorsiflexion - right;Decreased dorsiflexion - left;Decreased weight shift to right;Decreased weight shift to left;Trunk flexed   Ambulation Surface Level;Indoor     Knee/Hip Exercises: Machines for Strengthening   Cybex Leg Press 40# 2 sets 10 reps bil. LE's     Knee/Hip Exercises: Standing   Heel Raises Both;10 reps       Neuro Re-ed;  Ladder on floor - 2 reps with mod hand held assist to increase step length and to increase SLS On R and L foot Cone taps with each foot with UE support prn with CGA Touching balance bubbles with RUE support prn with CGA to improve SLS Stepping over and back of orange hurdle 5 reps each leg with LUE support on counter with min hand held assist in RUE           PT Short Term Goals - 01/22/16 1317      PT SHORT TERM GOAL  #1   Title She will be independent with intial HEP   Status Achieved     PT SHORT TERM GOAL #2   Title She wll report 25% decreaased soreness and fatigue   Status Not Met           PT Long Term Goals - 02/13/16 1005      PT LONG TERM GOAL #1   Title Pt will report at least 50% improvement in vertigo.  (01-20-16)/  new target 02-21-16   Status On-going     PT LONG TERM GOAL #2   Title Amb. 125' with RW with S on flat, even surface.  (01-20-16)   Status Achieved     PT LONG TERM GOAL #3   Title Independent in HEP for balance/vestibular exercises.  (01-20-16)   Status Achieved     PT LONG TERM GOAL #4   Title Improve TUG score by at least 5 secs to demo improved functional mobility.  (01-20-16)/ 02-21-16 new target date   Baseline 23.41 secs with RW on 02-12-16;  27 secs initially     PT LONG TERM GOAL #5   Title Report at least 25% improvement in vertigo with turning.  (02-21-16)   Status On-going     PT LONG TERM GOAL #6   Baseline 24.65 secs  gait velocity               Plan - 03/12/16 1023    Clinical Impression Statement Pt progressing with balance and gait; continues to have difficulty holding head erect which significantly impacts balance; postural abnormality also impacts COG (with posterior weight shift) and impacts balance as well   Rehab Potential Good   PT Frequency 1x / week   PT Duration 4 weeks   PT Treatment/Interventions ADLs/Self Care Home Management;Therapeutic activities;Stair training;Gait training;DME Instruction;Therapeutic exercise;Balance training;Patient/family education;Vestibular;Passive range of motion   PT Next Visit Plan cont with balance and gait training   PT Home Exercise Plan balance exercises   Consulted and Agree with Plan of Care Patient   Family Member Consulted husband      Patient will benefit from skilled therapeutic intervention in order to improve the following deficits and impairments:  Dizziness, Decreased balance,  Difficulty walking, Decreased strength, Postural dysfunction, Decreased range of motion  Visit Diagnosis: Other abnormalities of gait and mobility  Muscle weakness (generalized)     Problem List Patient Active Problem List   Diagnosis Date Noted  .  Dyspnea on exertion   . Other fatigue   . Palpitations   . Fatigue 12/26/2014  . Chest pain 12/26/2014  . Restrictive lung disease 01/05/2012  . GERD (gastroesophageal reflux disease) 01/05/2012  . Obstructive sleep apnea 01/05/2012  . Hypertension 01/05/2012    Alda Lea, PT 03/12/2016, 10:27 AM  [facility] [address] [place], [place], [address] Phone: [phone]   Fax:  [phone]  Name: MANON BANBURY MRN: [id number] Date of Birth: 08-29-1932

## 2016-03-13 NOTE — Telephone Encounter (Signed)
New Message  Pt has appt with MD-Nahser Tuesday, 04/22/2016, 2:00 pm listed as a former [facility] pt. Thanks!

## 2016-03-14 NOTE — Telephone Encounter (Signed)
New Message ° °Pt has appt with MD-Nahser Tuesday, 04/22/2016, 2:00 pm listed as a former [facility] pt. Thanks! °

## 2016-03-18 ENCOUNTER — Ambulatory Visit: Payer: Medicare Other | Admitting: Physical Therapy

## 2016-03-18 DIAGNOSIS — R2689 Other abnormalities of gait and mobility: Secondary | ICD-10-CM | POA: Diagnosis not present

## 2016-03-18 DIAGNOSIS — M6281 Muscle weakness (generalized): Secondary | ICD-10-CM

## 2016-03-19 NOTE — Therapy (Signed)
[facility] [address] [place], [place], [address] Phone: [phone]   Fax:  [phone]  Physical Therapy Treatment  Patient Details  Name: Kim Colon MRN: [id number] Date of Birth: [birth year] Referring Provider: Marton Redwood, MD  Encounter Date: 03/18/2016      PT End of Session - 03/19/16 1340    Visit Number 11   Number of Visits 12   Date for PT Re-Evaluation 03/21/16   Authorization Type [employer]   Authorization Time Period 01-21-16 - 03-21-16   PT Start Time 1150   PT Stop Time 1234   PT Time Calculation (min) 44 min      Past Medical History:  Diagnosis Date  . Arthritis   . Barrett esophagus   . Chronic back pain   . Chronic epigastric pain   . Colon polyps   . Daily headache   . Depression   . GERD (gastroesophageal reflux disease)   . Hip fracture ([facility])    left  . History of hiatal hernia   . HTN (hypertension)   . Hyperlipemia   . Kyphosis   . Osteoporosis with fracture    T12,T11, T10  . Pulmonary nodule 2006  . Restrictive lung disease    "because of the way my back is"  . Shingles   . Sleep apnea    "went thru study; found evidence of sleep apnea; never got a mask" (12/26/2014)  . Vitamin D deficiency disease     Past Surgical History:  Procedure Laterality Date  . APPENDECTOMY    . BUNIONECTOMY Right 08/2010  . CATARACT EXTRACTION W/ INTRAOCULAR LENS  IMPLANT, BILATERAL Bilateral   . CHOLECYSTECTOMY OPEN    . DILATION AND CURETTAGE OF UTERUS  1060's   "might have done one when I miscarried"  . ESOPHAGOGASTRODUODENOSCOPY (EGD) WITH ESOPHAGEAL DILATION  X 2?  . FRACTURE SURGERY    . ORIF HIP FRACTURE Left 10/2009  . TOE SURGERY Right 08/2010   "took some bones out of the 2 toes next to my big toe"  . TONSILLECTOMY    . TOTAL ABDOMINAL HYSTERECTOMY     w/BSO  . TUBAL LIGATION  1973  . VERTEBROPLASTY     T8    There were no vitals filed for this visit.       Subjective Assessment - 03/19/16 1331    Subjective Pt reports she wants to finish up therapy today - doesn't feel that it is helping significantly - still has trouble holding head upright, esp. when fatigued   Patient is accompained by: Family member   Limitations House hold activities;Walking;Sitting;Standing   Diagnostic tests last xrays last year   Patient Stated Goals resolve the vertigo and improve balance   Currently in Pain? No/denies                         [facility] Adult PT Treatment/Exercise - 03/19/16 0001      Transfers   Transfers Sit to Stand;Stand to Sit   Number of Reps 10 reps   Comments RUE support used minimally     Ambulation/Gait   Ambulation/Gait Yes   Ambulation/Gait Assistance 5: Supervision   Ambulation Distance (Feet) 240 Feet   Assistive device Rolling walker   Ambulation Surface Level;Indoor   Gait velocity 1.23  with RW     Timed Up and Go Test   Normal TUG (seconds) 28.3     Knee/Hip Exercises: Standing  Heel Raises Both;10 reps   Forward Step Up Both;1 set;10 reps      Self care; reviewed HEP and current status with pt having difficulty holding head upright:  Plan to trial 2" cervical collar for Assistance with neck extension to hold head upright to assist with balance by increasing visual field            PT Short Term Goals - 01/22/16 1317      PT SHORT TERM GOAL #1   Title She will be independent with intial HEP   Status Achieved     PT SHORT TERM GOAL #2   Title She wll report 25% decreaased soreness and fatigue   Status Not Met           PT Long Term Goals - 2016/04/08 1200      PT LONG TERM GOAL #1   Title Pt will report at least 50% improvement in vertigo.  (01-20-16)/  new target 02-21-16   Baseline pt reports she still has the dizziness/unsteadiness - doesn't see much difference (2016-04-08)   Time 4   Period Weeks   Status Not Met     PT LONG TERM GOAL #2   Title Amb. 125' with RW with S on  flat, even surface.  (01-20-16)   Baseline met 01-21-16   Status Achieved     PT LONG TERM GOAL #3   Title Independent in HEP for balance/vestibular exercises.  (01-20-16)   Baseline met 01-21-16   Status Achieved     PT LONG TERM GOAL #4   Title Improve TUG score by at least 5 secs to demo improved functional mobility.  (01-20-16)/ 02-21-16 new target date   Baseline 23.41 secs with RW on 02-12-16;  27 secs initially; 28.3 secs on 2016/04/08   Status Not Met     PT LONG TERM GOAL #5   Title Report at least 25% improvement in vertigo with turning.  (02-21-16)   Baseline pt reports no significant change in vertigo with turning   Status Not Met     PT LONG TERM GOAL #6   Baseline 24.65 secs  gait velocity;  26.72 secs               Plan - 03/19/16 1341    Clinical Impression Statement Pt has plateaued in maximizing functional progress at this time; pt is requesting discharge; pt has met LTG's #2 & 3:  #1, 4-5 not met due to continued c/o dizziness and unsteadiness with activity and especially with quick turns   Rehab Potential Good   PT Frequency 1x / week   PT Duration 4 weeks   PT Treatment/Interventions ADLs/Self Care Home Management;Therapeutic activities;Stair training;Gait training;DME Instruction;Therapeutic exercise;Balance training;Patient/family education;Vestibular;Passive range of motion   PT Next Visit Plan N/A - pt requesting D/C   PT Home Exercise Plan balance exercises   Consulted and Agree with Plan of Care Patient   Family Member Consulted husband      Patient will benefit from skilled therapeutic intervention in order to improve the following deficits and impairments:  Dizziness, Decreased balance, Difficulty walking, Decreased strength, Postural dysfunction, Decreased range of motion  Visit Diagnosis: Other abnormalities of gait and mobility  Muscle weakness (generalized)       G-Codes - 04/08/16 1346    Functional Assessment Tool Used TUG score 28.3  secs with RW:  pt cont to report dizziness/dysequilibrium with activity   Functional Limitation Mobility: Walking and moving around   Mobility: Walking  and Moving Around Goal Status [phone]) At least 40 percent but less than 60 percent impaired, limited or restricted   Mobility: Walking and Moving Around Discharge Status [phone]) At least 40 percent but less than 60 percent impaired, limited or restricted      Problem List Patient Active Problem List   Diagnosis Date Noted  . Dyspnea on exertion   . Other fatigue   . Palpitations   . Fatigue 12/26/2014  . Chest pain 12/26/2014  . Restrictive lung disease 01/05/2012  . GERD (gastroesophageal reflux disease) 01/05/2012  . Obstructive sleep apnea 01/05/2012  . Hypertension 01/05/2012    PHYSICAL THERAPY DISCHARGE SUMMARY  Visits from Start of Care: 11  Current functional level related to goals / functional outcomes: See above for progress towards goals - progress has been minimal in reducing "vertigo" as pt equates dizziness to dysequilibrium and unsteadiness;  Pt reports no significant reduction in vertigo since start of PT in July 2017:  Pt did not have signs/symptoms of BPPV  Pt is now able to amb. safely as RW has been obtained  Remaining deficits: Pt continues to be unable to hold head fully upright - has severe drop head posture with diffiiculty maintaining cervical extension for prolonged periods of time due to cerivcal musc. Weakness Continue c/o dizziness/vertigo with quick movements or turns Cont. Decreased standing balance; cont decr. Independence and safety with gait as pt is now using RW which greatly increases safety with ambulation and decreases fall risk   Education / Equipment: Pt has been instructed in HEP consisting of balance and strengthening exercises; plan on obtaining a 2" cervical collar for trial to assist with holding head upright  Plan: Patient agrees to discharge.  Patient goals were partially met.  Patient is being discharged due to lack of progress.  ?????       Pt is requesting D/C from PT at this time.  Alda Lea, PT 03/19/2016, 1:50 PM  [facility] [address] [place], [place], [address] Phone: [phone]   Fax:  [phone]  Name: Kim Colon MRN: [id number] Date of Birth: 1932-08-22

## 2016-03-25 ENCOUNTER — Other Ambulatory Visit: Payer: Self-pay | Admitting: Internal Medicine

## 2016-03-25 ENCOUNTER — Telehealth: Payer: Self-pay | Admitting: Internal Medicine

## 2016-03-25 MED ORDER — IRBESARTAN 75 MG PO TABS: ORAL_TABLET | ORAL | 0 refills | Status: DC

## 2016-03-25 NOTE — Telephone Encounter (Signed)
Called and spoke with pt and she is aware of refill that has been sent to her pharmacy. appt scheduled for pt to see RB in November. Nothing further is needed.

## 2016-03-27 ENCOUNTER — Encounter: Payer: Medicare Other | Admitting: Physical Therapy

## 2016-04-02 ENCOUNTER — Other Ambulatory Visit: Payer: Self-pay | Admitting: Cardiovascular Disease

## 2016-04-11 ENCOUNTER — Encounter: Payer: Self-pay | Admitting: Cardiovascular Disease

## 2016-04-11 ENCOUNTER — Other Ambulatory Visit: Payer: Self-pay | Admitting: Cardiovascular Disease

## 2016-04-16 ENCOUNTER — Encounter: Payer: Self-pay | Admitting: Emergency Medicine

## 2016-04-16 ENCOUNTER — Ambulatory Visit (INDEPENDENT_AMBULATORY_CARE_PROVIDER_SITE_OTHER): Payer: Medicare Other | Admitting: Emergency Medicine

## 2016-04-16 VITALS — BP 118/78 | HR 75 | Ht 59.0 in | Wt 114.0 lb

## 2016-04-16 DIAGNOSIS — R0602 Shortness of breath: Secondary | ICD-10-CM

## 2016-04-16 MED ORDER — ALBUTEROL SULFATE HFA 108 (90 BASE) MCG/ACT IN AERS: 2.0000 | INHALATION_SPRAY | Freq: Four times a day (QID) | RESPIRATORY_TRACT | 2 refills | Status: DC | PRN

## 2016-04-16 MED ORDER — AEROCHAMBER MV MISC: 0 refills | Status: AC

## 2016-04-16 NOTE — Assessment & Plan Note (Signed)
UA sx versus true obstruction. Will do a trial albuterol via spacer

## 2016-04-16 NOTE — Assessment & Plan Note (Signed)
As above.

## 2016-04-16 NOTE — Progress Notes (Signed)
Subjective:    Patient ID: Kim Colon, female DOB: 23-Mar-1933, 82 y.o. MRN: [id number]  HPI 82 yo never smoker with hx compression fx's, GERD, HTN, hyperlipidemia. Per Dr Alver FisherShaw's notes, also with a pulmonary nodule from 10/2004. She has been having some episodes of sudden awakening, feeling like she isn't moving air. This started happening about a year ago. She does have a hx of reflux a moderate sized hiatal hernia, reflux has come into her mouth at night - but the episodes now don't feel the same, aren't associated with a sour taste, etc. She has SOB with exertion that has evolved gradually. Her PFT in 10/2010 showed no AFL, some mild restriction, decreased diffusion that corrects for Va. Her husband says she snores. She is tired during the day - never refreshed in the am.   ROV 03/08/12 -- follows for her nocturnal dyspnea, awakenings - restrictive dz, hiatal hernia. Swallowing eval since last time questions primary esophageal dysphagia, no overt aspiration. She hasn't had sleep study yet - has wondered if problems are primarily GI. Her nocturnal awakenings have decreased, following w Dr Ewing SchleinMagod.  ROV 05/09/14 -- follow up visit for nocturnal SOB. Last seen here in 02/2012.  She has restrictive lung disease, restriction and a hiatal hernia. Also, we have been suspicious for OSA > never had a PSG to date. She still has nocturnal awakenings, feels SOB when she wakes up at night. She does not taste  Or feel acid at those times. She was seen by [employer] nurse recommended to her that she follow up with us again. She states that she has SOB with bending and with some exertion.   ROV 09/19/14 -- FOllow up visit for shortness of breath occurs particularly at night. We discussed performing a sleep study at her last visit in December. This was performed in March 2016 and showed evidence for obstructive sleep apnea even in light of limited sleep time.  She states that she still has many night time  awakenings. She continues to have exertional SOB with chores, especially w bending.   ROV 04/16/16 -- this is a follow-up visit for dyspnea in the setting of restrictive disease. She has a hiatal hernia, severe kyphosis. She also has OSA dx by PSG although she has not been willing to start therapy. She returns today reporting continued exertional dyspnea, sometimes feels that she is unable to move air in or out of her UA. She has GERD, on omperazole 40 bid + TUMS. she sometimes has food get stuck, won't go down completely.  Her ACE-I was stopped in 9/16, changed to Avapro, unclear whether this changed anything. She has been having imbalance for over a year, with attempting to walk, with moving her neck   Vitals:   04/16/16 1003 04/16/16 1004  BP:  118/78  Pulse:  75  SpO2:  98%  Weight: 114 lb (51.7 kg)   Height: 4\' 11"  (1.499 m)    Gen: Pleasant, kyphotic, in no distress,  normal affect, in wheelchair  ENT: No lesions,  mouth clear,  oropharynx clear, no postnasal drip  Neck: No JVD, no TMG, no carotid bruits  Lungs: No use of accessory muscles, decrease especially at L base, no crackles or wheezes.   Cardiovascular: RRR, heart sounds normal, no murmur or gallops, no peripheral edema  Musculoskeletal: No deformities, no cyanosis or clubbing  Neuro: alert, non focal  Skin: Warm, no lesions or rashes   Dyspnea on exertion Multifactorial - restriction from her HH,  kyphosis + deconditioning + sounds like a component of UA irritation and UA obstruction vs true lower airway obstruction. Needs to continue her GERD rx, restart her chlorpheniramine tabs for contribution drainage to her UA. Will do a trial albuterol to see if she benefits. Check PFT to compare w 2012.   Restrictive lung disease As above  Obstructive sleep apnea UA sx versus true obstruction. Will do a trial albuterol via spacer  Levy Pupaobert Byrum, MD, PhD 04/16/2016, 10:30 AM [facility] [phone]  or if no answer [phone]

## 2016-04-16 NOTE — Patient Instructions (Signed)
We will repeat your breathing tests to compare with 2012.  Please continue your omeprazole 40mg  twice a day We will try using albuterol 2 puffs if needed for shortness of breath or wheezing.  We may need to consider performing swallowing testing at some point in the future to insure no blockages, optimize reflux.  Follow with Dr Delton CoombesByrum in 2 months or sooner if you have any problems.

## 2016-04-16 NOTE — Assessment & Plan Note (Signed)
Multifactorial - restriction from her HH, kyphosis + deconditioning + sounds like a component of UA irritation and UA obstruction vs true lower airway obstruction. Needs to continue her GERD rx, restart her chlorpheniramine tabs for contribution drainage to her UA. Will do a trial albuterol to see if she benefits. Check PFT to compare w 2012.

## 2016-04-22 ENCOUNTER — Encounter: Payer: Self-pay | Admitting: Cardiovascular Disease

## 2016-04-22 ENCOUNTER — Ambulatory Visit (INDEPENDENT_AMBULATORY_CARE_PROVIDER_SITE_OTHER): Payer: Medicare Other | Admitting: Cardiovascular Disease

## 2016-04-22 VITALS — BP 135/70 | HR 90 | Ht 59.0 in | Wt 115.4 lb

## 2016-04-22 DIAGNOSIS — R002 Palpitations: Secondary | ICD-10-CM | POA: Diagnosis not present

## 2016-04-22 DIAGNOSIS — I10 Essential (primary) hypertension: Secondary | ICD-10-CM

## 2016-04-22 NOTE — Progress Notes (Signed)
Cardiology Office Note   Date:  04/22/2016   ID:  Kim Colon, DOB Jan 18, 1933, MRN [id number]  PCP:  Martha Clan, MD  Cardiologist: Cassell Clement MD - Nahser   Chief Complaint  Patient presents with  . Follow-up    palpitations   Problem List 1. Palpitations 2. Essential HTN 3.    Notes from [facility] is a 82 y.o. female who presents for  Follow-up office visit.  Kim Colon is a 82 y.o. female with history of HTN, HLD, GERD, Barrett's esophagus, significant kyphosis who presents for post-hospital follow-up.  She is a medical patient of [name].   Several months ago she was admitted for left sided CP associated with SOB and fatigue for several days. She also had reported a HR monitor showing HR 120s but had not felt any irregular HR. Initial EKG showed sinus tach 106 with nonspecific abnormalities. She r/o for MI and TSH was normal. CMET was normal except hypokalemia, hypomagnesemia. 2D Echo 12/28/14: EF 55-60%, no RWMA, normal diastolic parameters. It was felt since echo was normal, would reserve stress testing for recurrent CP.  On 01/08/15 she had a 48 hour Holter monitor which was normal. On 01/22/15 she underwent a Myoview stress test which showed an ejection fraction of 92% and was low risk and showed no ischemia. She was empirically placed on metoprolol and has felt better with less palpitations and less forceful heart action.  Nov.  28, 2017 Kim Colon is seen for the first time today .   Transfer from [facility]. Has hx of palpitations -   Past Medical History:  Diagnosis Date  . Arthritis   . Barrett esophagus   . Chronic back pain   . Chronic epigastric pain   . Colon polyps   . Daily headache   . Depression   . GERD (gastroesophageal reflux disease)   . Hip fracture ([facility])    left  . History of hiatal hernia   . HTN (hypertension)   . Hyperlipemia   . Kyphosis   . Osteoporosis with fracture    T12,T11, T10  . Pulmonary nodule  2006  . Restrictive lung disease    "because of the way my back is"  . Shingles   . Sleep apnea    "went thru study; found evidence of sleep apnea; never got a mask" (12/26/2014)  . Vitamin D deficiency disease     Past Surgical History:  Procedure Laterality Date  . APPENDECTOMY    . BUNIONECTOMY Right 08/2010  . CATARACT EXTRACTION W/ INTRAOCULAR LENS  IMPLANT, BILATERAL Bilateral   . CHOLECYSTECTOMY OPEN    . DILATION AND CURETTAGE OF UTERUS  1060's   "might have done one when I miscarried"  . ESOPHAGOGASTRODUODENOSCOPY (EGD) WITH ESOPHAGEAL DILATION  X 2?  . FRACTURE SURGERY    . ORIF HIP FRACTURE Left 10/2009  . TOE SURGERY Right 08/2010   "took some bones out of the 2 toes next to my big toe"  . TONSILLECTOMY    . TOTAL ABDOMINAL HYSTERECTOMY     w/BSO  . TUBAL LIGATION  1973  . VERTEBROPLASTY     T8     Current Outpatient Prescriptions  Medication Sig Dispense Refill  . acetaminophen (TYLENOL) 325 MG tablet Take 325 mg by mouth 3 (three) times daily.    Marland Kitchen albuterol (PROVENTIL HFA;VENTOLIN HFA) 108 (90 Base) MCG/ACT inhaler Inhale 2 puffs into the lungs every 6 (six) hours  as needed for wheezing or shortness of breath. 1 Inhaler 2  . calcium carbonate (TUMS - DOSED IN MG ELEMENTAL CALCIUM) 500 MG chewable tablet Chew 1 tablet by mouth daily as needed for heartburn.    . Calcium Carbonate-Vitamin D (CALCIUM-D) 600-400 MG-UNIT TABS Take 1 capsule by mouth daily at 12 noon.     . Cholecalciferol (VITAMIN D3) 1000 UNITS CAPS Take 1 capsule by mouth daily at 12 noon.     . diclofenac sodium (VOLTAREN) 1 % GEL Apply 1 application topically at bedtime as needed (pain).     . hydrochlorothiazide (HYDRODIURIL) 25 MG tablet TAKE 1 TABLET (25 MG TOTAL) BY MOUTH DAILY. 30 tablet 0  . hydrocortisone 2.5 % lotion Apply topically 2 (two) times daily. 59 mL 0  . irbesartan (AVAPRO) 75 MG tablet TAKE 1 TABLET BY MOUTH DAILY 90 tablet 0  . KLOR-CON M10 10 MEQ tablet TAKE 1 TABLET BY MOUTH  EVERY DAY OFFICE VISIT REQUIRED FOR FURTHER REFILLS 30 tablet 11  . metoprolol succinate (TOPROL-XL) 25 MG 24 hr tablet TAKE 1 TABLET (25 MG TOTAL) BY MOUTH DAILY. 90 tablet 1  . Multiple Vitamins-Minerals (COMPLETE MULTIVITAMIN/MINERAL PO) Take 1 capsule by mouth daily at 12 noon.     Marland Kitchen. NASAL SALINE NA Place 1 spray into the nose daily as needed (dry nose).    Marland Kitchen. omeprazole (PRILOSEC) 40 MG capsule Take 40 mg by mouth 2 (two) times daily.     . Polyvinyl Alcohol-Povidone (REFRESH OP) Place 1 drop into both eyes daily as needed (dry eyes).    . pyridOXINE (VITAMIN B-6) 100 MG tablet Take 100 mg by mouth daily at 12 noon.     Marland Kitchen. Spacer/Aero-Holding Chambers (AEROCHAMBER MV) inhaler Use as instructed 1 each 0  . vitamin B-12 (CYANOCOBALAMIN) 500 MCG tablet Take 500 mcg by mouth daily at 12 noon.     . Vitamin D, Ergocalciferol, (DRISDOL) 50000 UNITS CAPS Take 50,000 Units by mouth every 30 (thirty) days.     No current facility-administered medications for this visit.     Allergies:   Duloxetine; Amlodipine; and Biaxin [clarithromycin]    Social History:  The patient  reports that she has never smoked. She has never used smokeless tobacco. She reports that she does not drink alcohol or use drugs.   Family History:  The patient's family history includes Cancer in her father; Diabetes type II in her sister; Heart attack in her brother and mother; Heart failure in her father; Osteopenia in her daughter and daughter; Parkinsonism in her brother; Stroke in her brother.    ROS:  Please see the history of present illness.   Otherwise, review of systems are positive for none.   All other systems are reviewed and negative.    PHYSICAL EXAM: VS:  BP 135/70   Pulse 90   Ht 4\' 11"  (1.499 m)   Wt 115 lb 6.4 oz (52.3 kg)   BMI 23.31 kg/m  , BMI Body mass index is 23.31 kg/m. GEN: Well nourished, well developed, in no acute distress.  moderate kyphosis  HEENT: normal  Neck: no JVD, carotid bruits, or  masses Cardiac: RRR; no murmurs, rubs, or gallops,no edema  Respiratory:  clear to auscultation bilaterally, normal work of breathing GI: soft, nontender, nondistended, + BS Kim: severe kyphosis,  Examined in a wheelchair Skin: warm and dry, no rash Neuro:  Strength and sensation are intact Psych: euthymic mood, full affect   EKG:  EKG is ordered today. NSR  at 90.   RAD .    Recent Labs: No results found for requested labs within last 8760 hours.    Lipid Panel No results found for: CHOL, TRIG, HDL, CHOLHDL, VLDL, LDLCALC, LDLDIRECT    Wt Readings from Last 3 Encounters:  04/22/16 115 lb 6.4 oz (52.3 kg)  04/16/16 114 lb (51.7 kg)  04/18/15 114 lb 12.8 oz (52.1 kg)       ASSESSMENT AND PLAN:  1.  chest pain felt to be noncardiac. Normal Holter monitor.  Normal Lexiscan Myoview stress test. 2. Palpitations -  Improved on low dose metoprolol succinate 25 mg daily.   She will follow up with Dr. Clelia CroftShaw.    3. Essential HTN - follow BP with above changes. She keeps a daily log. 4.  kyphosis   Current medicines are reviewed at length with the patient today.  The patient does not have concerns regarding medicines.  The following changes have been made:  no change   Will follow up with her as needed     Kristeen MissPhilip Nahser, MD  04/22/2016 6:17 PM    [facility] [address],  [address] [place], [place]  [address] Pager [phone] Phone: [phone]; Fax: [phone]

## 2016-04-22 NOTE — Patient Instructions (Signed)

## 2016-04-24 ENCOUNTER — Ambulatory Visit (HOSPITAL_COMMUNITY)
Admission: RE | Admit: 2016-04-24 | Discharge: 2016-04-24 | Disposition: A | Discharge status: Home / Self Care | Payer: Medicare Other | Source: Ambulatory Visit | Attending: Emergency Medicine | Admitting: Emergency Medicine

## 2016-04-24 DIAGNOSIS — R0602 Shortness of breath: Secondary | ICD-10-CM | POA: Diagnosis not present

## 2016-04-24 LAB — PULMONARY FUNCTION TEST
DL/VA % PRED: 123 %
DL/VA: 4.05 ml/min/mmHg/L
DLCO unc % pred: 113 %
DLCO unc: 13.82 ml/min/mmHg
FEF 25-75 POST: 0.83 L/s
FEF 25-75 Pre: 0.99 L/sec
FEF2575-%CHANGE-POST: -16 %
FEF2575-%PRED-POST: 100 %
FEF2575-%Pred-Pre: 119 %
FEV1-%CHANGE-POST: 5 %
FEV1-%PRED-PRE: 95 %
FEV1-%Pred-Post: 101 %
FEV1-POST: 1.09 L
FEV1-PRE: 1.03 L
FEV1FVC-%CHANGE-POST: 6 %
FEV1FVC-%PRED-PRE: 98 %
FEV6-%Change-Post: 4 %
FEV6-%PRED-PRE: 95 %
FEV6-%Pred-Post: 100 %
FEV6-POST: 1.39 L
FEV6-Pre: 1.33 L
FEV6FVC-%PRED-POST: 108 %
FEV6FVC-%Pred-Pre: 108 %
FVC-%CHANGE-POST: -1 %
FVC-%PRED-POST: 94 %
FVC-%PRED-PRE: 95 %
FVC-PRE: 1.44 L
FVC-Post: 1.43 L
PRE FEV1/FVC RATIO: 72 %
PRE FEV6/FVC RATIO: 100 %
Post FEV1/FVC ratio: 77 %
Post FEV6/FVC ratio: 100 %
RV % PRED: 89 %
RV: 1.75 L
TLC % pred: 84 %
TLC: 3.14 L

## 2016-04-24 MED ORDER — ALBUTEROL SULFATE (2.5 MG/3ML) 0.083% IN NEBU
2.5000 mg | INHALATION_SOLUTION | Freq: Once | RESPIRATORY_TRACT | Status: AC
  Administered 2016-04-24: 2.5 mg via RESPIRATORY_TRACT

## 2016-04-25 ENCOUNTER — Other Ambulatory Visit: Payer: Self-pay | Admitting: Physician Assistant

## 2016-05-07 ENCOUNTER — Other Ambulatory Visit: Payer: Self-pay | Admitting: Cardiovascular Disease

## 2016-06-16 ENCOUNTER — Ambulatory Visit: Payer: Medicare Other | Admitting: Emergency Medicine

## 2016-06-23 ENCOUNTER — Other Ambulatory Visit: Payer: Self-pay | Admitting: Emergency Medicine

## 2016-08-20 ENCOUNTER — Ambulatory Visit: Payer: Medicare Other | Admitting: Acute Care

## 2016-09-29 ENCOUNTER — Ambulatory Visit (INDEPENDENT_AMBULATORY_CARE_PROVIDER_SITE_OTHER): Payer: Medicare Other | Admitting: Emergency Medicine

## 2016-09-29 ENCOUNTER — Encounter: Payer: Self-pay | Admitting: Emergency Medicine

## 2016-09-29 DIAGNOSIS — J984 Other disorders of lung: Secondary | ICD-10-CM

## 2016-09-29 DIAGNOSIS — G4733 Obstructive sleep apnea (adult) (pediatric): Secondary | ICD-10-CM

## 2016-09-29 NOTE — Progress Notes (Signed)
Subjective:    Patient ID: Kim AldermanAlmeta S Harbin, female DOB: 1932-09-06, 83 y.o. MRN: [id number]  HPI 83 yo never smoker with hx compression fx's, GERD, HTN, hyperlipidemia. Per Dr Alver FisherShaw's notes, also with a pulmonary nodule from 10/2004. She has been having some episodes of sudden awakening, feeling like she isn't moving air. This started happening about a year ago. She does have a hx of reflux a moderate sized hiatal hernia, reflux has come into her mouth at night - but the episodes now don't feel the same, aren't associated with a sour taste, etc. She has SOB with exertion that has evolved gradually. Her PFT in 10/2010 showed no AFL, some mild restriction, decreased diffusion that corrects for Va. Her husband says she snores. She is tired during the day - never refreshed in the am.   ROV 03/08/12 -- follows for her nocturnal dyspnea, awakenings - restrictive dz, hiatal hernia. Swallowing eval since last time questions primary esophageal dysphagia, no overt aspiration. She hasn't had sleep study yet - has wondered if problems are primarily GI. Her nocturnal awakenings have decreased, following w Dr Ewing SchleinMagod.  ROV 05/09/14 -- follow up visit for nocturnal SOB. Last seen here in 02/2012.  She has restrictive lung disease, restriction and a hiatal hernia. Also, we have been suspicious for OSA > never had a PSG to date. She still has nocturnal awakenings, feels SOB when she wakes up at night. She does not taste  Or feel acid at those times. She was seen by [employer] nurse recommended to her that she follow up with us again. She states that she has SOB with bending and with some exertion.   ROV 09/19/14 -- FOllow up visit for shortness of breath occurs particularly at night. We discussed performing a sleep study at her last visit in December. This was performed in March 2016 and showed evidence for obstructive sleep apnea even in light of limited sleep time.  She states that she still has many night time  awakenings. She continues to have exertional SOB with chores, especially w bending.   ROV 04/16/16 -- this is a follow-up visit for dyspnea in the setting of restrictive disease. She has a hiatal hernia, severe kyphosis. She also has OSA dx by PSG although she has not been willing to start therapy. She returns today reporting continued exertional dyspnea, sometimes feels that she is unable to move air in or out of her UA. She has GERD, on omperazole 40 bid + TUMS. she sometimes has food get stuck, won't go down completely.  Her ACE-I was stopped in 9/16, changed to Avapro, unclear whether this changed anything. She has been having imbalance for over a year, with attempting to walk, with moving her neck  ROV 09/29/16 -- patient has a history of restrictive lung disease due to severe kyphoscoliosis and a hiatal hernia. She also has obstructive sleep apnea but is not on CPAP therapy. Occasional dysphagia. She underwent repeat pulmonary function testing after her last visit. I have reviewed. The flow volume curve is consistent with restriction. Her role numbers have been adjusted for her new height given her severe kyphoscoliosis. When adjusted for a height of 55 inches (from 62 in) her lung volumes are actually normal.  She is having some trouble with balance. Does not walk very much due to this and also due to SOB.  She has albuterol available but does not feel that she needs it. She has a lot of throat irritation, has GERD sx but  not as severe as it used to be.      Vitals:   09/29/16 1507  BP: 128/62  Pulse: 84  SpO2: 96%  Weight: 115 lb 3.2 oz (52.3 kg)  Height: 4\' 11"  (1.499 m)   Gen: Pleasant, kyphotic, in no distress,  normal affect, in wheelchair  ENT: No lesions,  mouth clear,  oropharynx clear, no postnasal drip  Neck: No JVD, no TMG, no carotid bruits  Lungs: No use of accessory muscles, Bibasilar inspiratory crackles, decreased at both bases  Cardiovascular: RRR, heart sounds normal,  no murmur or gallops, no peripheral edema  Musculoskeletal: No deformities, no cyanosis or clubbing  Neuro: alert, non focal  Skin: Warm, no lesions or rashes   Restrictive lung disease Based on her pulmonary function testing and symptoms I believe that her kyphoscoliosis and hiatal hernia are her biggest contributors to restrictive disease and dyspnea. She has an albuterol inhaler that she can use as needed but agree that this is low yield.  Obstructive sleep apnea Untreated  Levy Pupa, MD, PhD 09/29/2016, 3:25 PM [facility] [phone] or if no answer [phone]

## 2016-09-29 NOTE — Patient Instructions (Addendum)
You can use albuterol inhaler 2 puffs as needed for shortness of breath.  We will not add any new inhaled medications at this time.  Follow with Dr Delton CoombesByrum in 12 months or sooner if you have any problems

## 2016-09-29 NOTE — Assessment & Plan Note (Signed)
Untreated  

## 2016-09-29 NOTE — Assessment & Plan Note (Signed)
Based on her pulmonary function testing and symptoms I believe that her kyphoscoliosis and hiatal hernia are her biggest contributors to restrictive disease and dyspnea. She has an albuterol inhaler that she can use as needed but agree that this is low yield.

## 2016-12-19 ENCOUNTER — Other Ambulatory Visit: Payer: Self-pay | Admitting: Emergency Medicine

## 2017-01-27 ENCOUNTER — Other Ambulatory Visit: Payer: Self-pay | Admitting: Cardiovascular Disease

## 2017-02-25 ENCOUNTER — Other Ambulatory Visit: Payer: Self-pay | Admitting: Cardiovascular Disease

## 2017-03-26 ENCOUNTER — Other Ambulatory Visit: Payer: Self-pay | Admitting: Cardiovascular Disease

## 2017-03-26 MED ORDER — POTASSIUM CHLORIDE CRYS ER 10 MEQ PO TBCR: EXTENDED_RELEASE_TABLET | ORAL | 0 refills | Status: DC

## 2017-03-30 ENCOUNTER — Other Ambulatory Visit: Payer: Self-pay | Admitting: Cardiovascular Disease

## 2017-04-05 NOTE — Progress Notes (Signed)
Cardiology Office Note    Date:  04/07/2017   ID:  Kim Colon, DOB [birth year], MRN [id number]  PCP:  Martha Clan, MD  Cardiologist:  Dr. Elease Hashimoto (previously Dr. Patty Sermons)  Chief Complaint: yearly follow up  History of Present Illness:   Kim Colon is a 83 y.o. female HTN, HLD, GERD, Barrett's esophagus, significant kyphosis and untreated sleep apnea presented for follow up.   She was admitted in 2016 for left sided CP associated with SOB and fatigue for several days. She also had reported a HR monitor showing HR 120s but had not felt any irregular HR. Initial EKG showed sinus tach 106 with nonspecific abnormalities. She r/o for MI and TSH was normal. CMET was normal except hypokalemia, hypomagnesemia. 2D Echo 12/28/14: EF 55-60%, no RWMA, normal diastolic parameters. It was felt since echo was normal, would reserve stress testing for recurrent CP.  On 01/08/15 she had a 48 hour Holter monitor which was normal. On 01/22/15 she underwent a Myoview stress test which showed an ejection fraction of 92% and was low risk and showed no ischemia. She was empirically placed on metoprolol and has felt better with less palpitations and less forceful heart action.  She was doing well on cardiac stand point when last seen by Dr. Elease Hashimoto 03/2016.  Here today for follow up. Overall doing well. She has chronic balance issue due to knee/hip and khyposis. She uses walker at home. intermittent dizziness while standing up for many years. Stable per patient. She gets balance before movement. No chest pain, palpitation, orthopnea or PND.    Past Medical History:  Diagnosis Date  . Arthritis   . Barrett esophagus   . Chronic back pain   . Chronic epigastric pain   . Colon polyps   . Daily headache   . Depression   . GERD (gastroesophageal reflux disease)   . Hip fracture (HCC)    left  . History of hiatal hernia   . HTN (hypertension)   . Hyperlipemia   . Kyphosis   . Osteoporosis with  fracture    T12,T11, T10  . Pulmonary nodule 2006  . Restrictive lung disease    "because of the way my back is"  . Shingles   . Sleep apnea    "went thru study; found evidence of sleep apnea; never got a mask" (12/26/2014)  . Vitamin D deficiency disease     Past Surgical History:  Procedure Laterality Date  . APPENDECTOMY    . BUNIONECTOMY Right 08/2010  . CATARACT EXTRACTION W/ INTRAOCULAR LENS  IMPLANT, BILATERAL Bilateral   . CHOLECYSTECTOMY OPEN    . DILATION AND CURETTAGE OF UTERUS  1060's   "might have done one when I miscarried"  . ESOPHAGOGASTRODUODENOSCOPY (EGD) WITH ESOPHAGEAL DILATION  X 2?  . FRACTURE SURGERY    . ORIF HIP FRACTURE Left 10/2009  . TOE SURGERY Right 08/2010   "took some bones out of the 2 toes next to my big toe"  . TONSILLECTOMY    . TOTAL ABDOMINAL HYSTERECTOMY     w/BSO  . TUBAL LIGATION  1973  . VERTEBROPLASTY     T8    Current Medications: Prior to Admission medications   Medication Sig Start Date End Date Taking? Authorizing Provider  acetaminophen (TYLENOL) 325 MG tablet Take 325 mg by mouth 3 (three) times daily.    [provider]  albuterol (PROVENTIL HFA;VENTOLIN HFA) 108 (90 Base) MCG/ACT inhaler Inhale 2 puffs into the lungs every  6 (six) hours as needed for wheezing or shortness of breath. 04/16/16   Leslye PeerByrum, Robert S, MD  calcium carbonate (TUMS - DOSED IN MG ELEMENTAL CALCIUM) 500 MG chewable tablet Chew 1 tablet by mouth daily as needed for heartburn.    [provider]  Calcium Carbonate-Vitamin D (CALCIUM-D) 600-400 MG-UNIT TABS Take 1 capsule by mouth daily at 12 noon.     [provider]  Cholecalciferol (VITAMIN D3) 1000 UNITS CAPS Take 1 capsule by mouth daily at 12 noon.     [provider]  diclofenac sodium (VOLTAREN) 1 % GEL Apply 1 application topically at bedtime as needed (pain).     [provider]  hydrochlorothiazide (HYDRODIURIL) 25 MG tablet TAKE 1 TABLET BY MOUTH EVERY DAY  03/30/17   Nahser, Deloris PingPhilip J, MD  hydrocortisone 2.5 % lotion Apply topically 2 (two) times daily. 11/03/15   Charm RingsHonig, Erin J, MD  irbesartan (AVAPRO) 75 MG tablet TAKE 1 TABLET BY MOUTH DAILY 12/19/16   Nyoka CowdenWert, Michael B, MD  metoprolol succinate (TOPROL-XL) 25 MG 24 hr tablet TAKE 1 TABLET BY MOUTH EVERY DAY 04/25/16   Dunn, Tacey Ruizayna N, PA-C  Multiple Vitamins-Minerals (COMPLETE MULTIVITAMIN/MINERAL PO) Take 1 capsule by mouth daily at 12 noon.     [provider]  NASAL SALINE NA Place 1 spray into the nose daily as needed (dry nose).    [provider]  omeprazole (PRILOSEC) 40 MG capsule Take 40 mg by mouth 2 (two) times daily.     [provider]  Polyvinyl Alcohol-Povidone (REFRESH OP) Place 1 drop into both eyes daily as needed (dry eyes).    [provider]  potassium chloride (KLOR-CON M10) 10 MEQ tablet TAKE 1 TABLET BY MOUTH EVERY DAY. Please keep upcoming appt for future refills. Thanks 03/26/17   Nahser, Deloris PingPhilip J, MD  pyridOXINE (VITAMIN B-6) 100 MG tablet Take 100 mg by mouth daily at 12 noon.     [provider]  Spacer/Aero-Holding Chambers (AEROCHAMBER MV) inhaler Use as instructed 04/16/16   Leslye PeerByrum, Robert S, MD  vitamin B-12 (CYANOCOBALAMIN) 500 MCG tablet Take 500 mcg by mouth daily at 12 noon.     [provider]  Vitamin D, Ergocalciferol, (DRISDOL) 50000 UNITS CAPS Take 50,000 Units by mouth every 30 (thirty) days.    [provider]    Allergies:   Duloxetine; Amlodipine; and Biaxin [clarithromycin]   Social History   Socioeconomic History  . Marital status: Married    Spouse name: None  . Number of children: 3  . Years of education: None  . Highest education level: None  Social Needs  . Financial resource strain: None  . Food insecurity - worry: None  . Food insecurity - inability: None  . Transportation needs - medical: None  . Transportation needs - non-medical: None  Occupational History    Comment:  farming/housewife  Tobacco Use  . Smoking status: Never Smoker  . Smokeless tobacco: Never Used  Substance and Sexual Activity  . Alcohol use: No  . Drug use: No  . Sexual activity: No  Other Topics Concern  . None  Social History Narrative  . None     Family History:  The patient's family history includes Cancer in her father; Diabetes type II in her sister; Heart attack in her brother and mother; Heart failure in her father; Osteopenia in her daughter and daughter; Parkinsonism in her brother; Stroke in her brother.   ROS:   Please see the  history of present illness.    ROS All other systems reviewed and are negative.   PHYSICAL EXAM:   VS:  BP 126/70   Pulse 84   Ht 4\' 11"  (1.499 m)   Wt 114 lb 12 oz (52.1 kg)   SpO2 94%   BMI 23.18 kg/m    GEN: Elderly female in  no acute distress  HEENT: normal  Neck: no JVD, carotid bruits, or masses Cardiac:RRR; no murmurs, rubs, or gallops,no edema  Respiratory:  clear to auscultation bilaterally, normal work of breathing GI: soft, nontender, nondistended, + BS MS: no deformity or atrophy  Skin: warm and dry, no rash Neuro:  Alert and Oriented x 3, Strength and sensation are intact Psych: euthymic mood, full affect  Wt Readings from Last 3 Encounters:  04/07/17 114 lb 12 oz (52.1 kg)  09/29/16 115 lb 3.2 oz (52.3 kg)  04/22/16 115 lb 6.4 oz (52.3 kg)      Studies/Labs Reviewed:   EKG:  EKG is ordered today.  The ekg ordered today demonstrates NSR  Recent Labs: No results found for requested labs within last 8760 hours.   Lipid Panel No results found for: CHOL, TRIG, HDL, CHOLHDL, VLDL, LDLCALC, LDLDIRECT  Additional studies/ records that were reviewed today include:   As above  ASSESSMENT & PLAN:    1. HTN - Stable and well controlled on current medication. Intermittent chronic dizziness. Advised to get balance before movement, No change in medications today.   2. Palpitations - prior normal holter monitor.  No reoccurrence. Continue BB.   3. Resting Tremors - Advised to follow up with PCP  4. OSA - Untreated   5. Other chest pain - not typical of angina. Prior work up negative. Seem her main issue is kyphosis and other generalized orthopedic issue.    Medication Adjustments/Labs and Tests Ordered: Current medicines are reviewed at length with the patient today.  Concerns regarding medicines are outlined above.  Medication changes, Labs and Tests ordered today are listed in the Patient Instructions below. There are no Patient Instructions on file for this visit.   Lorelei PontSigned, Stanly Si, GeorgiaPA  04/07/2017 1:46 PM    [facility] [address], [place], [place]  [address] Phone: [phone]; Fax: [phone]

## 2017-04-07 ENCOUNTER — Ambulatory Visit: Payer: Medicare Other | Admitting: Physician Assistant

## 2017-04-07 ENCOUNTER — Encounter: Payer: Self-pay | Admitting: Physician Assistant

## 2017-04-07 VITALS — BP 126/70 | HR 84 | Ht 59.0 in | Wt 114.8 lb

## 2017-04-07 DIAGNOSIS — R259 Unspecified abnormal involuntary movements: Secondary | ICD-10-CM

## 2017-04-07 DIAGNOSIS — Z87898 Personal history of other specified conditions: Secondary | ICD-10-CM

## 2017-04-07 DIAGNOSIS — G4733 Obstructive sleep apnea (adult) (pediatric): Secondary | ICD-10-CM | POA: Diagnosis not present

## 2017-04-07 DIAGNOSIS — I10 Essential (primary) hypertension: Secondary | ICD-10-CM

## 2017-04-07 DIAGNOSIS — R0789 Other chest pain: Secondary | ICD-10-CM

## 2017-04-07 DIAGNOSIS — G252 Other specified forms of tremor: Secondary | ICD-10-CM

## 2017-04-07 MED ORDER — POTASSIUM CHLORIDE CRYS ER 10 MEQ PO TBCR: EXTENDED_RELEASE_TABLET | ORAL | 0 refills | Status: DC

## 2017-04-07 MED ORDER — HYDROCHLOROTHIAZIDE 25 MG PO TABS: 25.0000 mg | ORAL_TABLET | Freq: Every day | ORAL | 1 refills | Status: DC

## 2017-04-07 MED ORDER — METOPROLOL SUCCINATE ER 25 MG PO TB24: 25.0000 mg | ORAL_TABLET | Freq: Every day | ORAL | 3 refills | Status: DC

## 2017-04-07 NOTE — Patient Instructions (Addendum)
Medication Instructions:  Your physician recommends that you continue on your current medications as directed. Please refer to the Current Medication list given to you today.   Labwork: TODAY:  BMET  Testing/Procedures: None ordered  Follow-Up: Your physician wants you to follow-up in: 1 YEAR WITH DR. Elease HashimotoNAHSER  You will receive a reminder letter in the mail two months in advance. If you don't receive a letter, please call our office to schedule the follow-up appointment.   Any Other Special Instructions Will Be Listed Below (If Applicable).    If you need a refill on your cardiac medications before your next appointment, please call your pharmacy.

## 2017-04-08 LAB — BASIC METABOLIC PANEL
BUN/Creatinine Ratio: 24 (ref 12–28)
BUN: 24 mg/dL (ref 8–27)
CALCIUM: 9.9 mg/dL (ref 8.7–10.3)
CO2: 24 mmol/L (ref 20–29)
CREATININE: 1.01 mg/dL — AB (ref 0.57–1.00)
Chloride: 101 mmol/L (ref 96–106)
GFR, EST AFRICAN AMERICAN: 60 mL/min/{1.73_m2} (ref >59)
GFR, EST NON AFRICAN AMERICAN: 52 mL/min/{1.73_m2} — AB (ref >59)
Glucose: 135 mg/dL — ABNORMAL HIGH (ref 65–99)
Potassium: 3.9 mmol/L (ref 3.5–5.2)
Sodium: 141 mmol/L (ref 134–144)

## 2017-06-13 ENCOUNTER — Other Ambulatory Visit: Payer: Self-pay | Admitting: Internal Medicine

## 2017-06-15 ENCOUNTER — Telehealth: Payer: Self-pay | Admitting: Emergency Medicine

## 2017-06-15 NOTE — Telephone Encounter (Signed)
Called and spoke with pt. Pt is requesting refill for Avapro 75mg . It appears that Rx was prescribed by MW on 02/06/15. Last refilled 06/12/16 90 tabs with 1 refill.  Pt aware that message will be sent to RB. Pt is okay with waiting until 06/16/17 for response.  Pt has pending recall with RB around 09/2017.  RB please advise on refill. Thanks.

## 2017-06-17 NOTE — Telephone Encounter (Signed)
Pt is calling back about the Avapro 75mg     [phone]

## 2017-06-17 NOTE — Telephone Encounter (Signed)
Pt is aware of below message and voiced her understanding. Nothing further is needed. 

## 2017-06-17 NOTE — Telephone Encounter (Signed)
Needs to get this from her PCP who should be managing her BP  LMTCB

## 2017-06-17 NOTE — Telephone Encounter (Signed)
RB please advise. Thanks.  

## 2017-06-17 NOTE — Telephone Encounter (Signed)
Agree I will defer this to her primary care physician

## 2017-06-24 ENCOUNTER — Other Ambulatory Visit: Payer: Self-pay | Admitting: Physician Assistant

## 2017-10-04 ENCOUNTER — Ambulatory Visit (HOSPITAL_COMMUNITY)
Admission: EM | Admit: 2017-10-04 | Discharge: 2017-10-04 | Disposition: A | Discharge status: Home / Self Care | Payer: Medicare Other | Attending: Internal Medicine | Admitting: Internal Medicine

## 2017-10-04 ENCOUNTER — Ambulatory Visit (INDEPENDENT_AMBULATORY_CARE_PROVIDER_SITE_OTHER): Payer: Medicare Other

## 2017-10-04 ENCOUNTER — Encounter (HOSPITAL_COMMUNITY): Payer: Self-pay | Admitting: Emergency Medicine

## 2017-10-04 DIAGNOSIS — M79605 Pain in left leg: Secondary | ICD-10-CM

## 2017-10-04 DIAGNOSIS — M79606 Pain in leg, unspecified: Secondary | ICD-10-CM

## 2017-10-04 NOTE — ED Triage Notes (Signed)
Pt here for left hip and leg pain with some swelling; pt denies fall or injury

## 2017-10-04 NOTE — Discharge Instructions (Addendum)
Please take tylenol as you have been.  Please call your doctor for an appointment this week if possible.

## 2017-10-04 NOTE — ED Provider Notes (Signed)
10/04/2017 5:50 PM   DOB: [birth year] / MRN: [id number]  SUBJECTIVE:  Kim Colon is a frail 84 y.o. female presenting for left hip and knee pain worsening over the past week.  She has a history of osteoporosis and is status post left femoral subcapital fracture repair in 2011.  The pain is worse with ambulation. She associates mild swelling at the ankle. She denies rash, fever, calf pain, SOB and chest pain.  She feels the pain is worsening.   She is allergic to duloxetine; amlodipine; and biaxin [clarithromycin].   She  has a past medical history of Arthritis, Barrett esophagus, Chronic back pain, Chronic epigastric pain, Colon polyps, Daily headache, Depression, GERD (gastroesophageal reflux disease), Hip fracture (HCC), History of hiatal hernia, HTN (hypertension), Hyperlipemia, Kyphosis, Osteoporosis with fracture, Pulmonary nodule (2006), Restrictive lung disease, Shingles, Sleep apnea, and Vitamin D deficiency disease.    She  reports that she has never smoked. She has never used smokeless tobacco. She reports that she does not drink alcohol or use drugs. She  reports that she does not engage in sexual activity. The patient  has a past surgical history that includes ORIF hip fracture (Left, 10/2009); Appendectomy; Bunionectomy (Right, 08/2010); Toe Surgery (Right, 08/2010); Tonsillectomy; Cholecystectomy open; Cataract extraction w/ intraocular lens  implant, bilateral (Bilateral); Fracture surgery; Total abdominal hysterectomy; Tubal ligation (1973); Dilation and curettage of uterus (1060's); Vertebroplasty; and Esophagogastroduodenoscopy (egd) with esophageal dilation (X 2?).  Her family history includes Cancer in her father; Diabetes type II in her sister; Heart attack in her brother and mother; Heart failure in her father; Osteopenia in her daughter and daughter; Parkinsonism in her brother; Stroke in her brother.  Review of Systems  Constitutional: Negative for chills, diaphoresis and fever.    Respiratory: Negative for cough, hemoptysis, sputum production, shortness of breath and wheezing.   Cardiovascular: Negative for chest pain, orthopnea and leg swelling.  Gastrointestinal: Negative for nausea.  Musculoskeletal: Positive for joint pain and myalgias. Negative for back pain, falls and neck pain.  Skin: Negative for rash.  Neurological: Negative for dizziness, sensory change, speech change, focal weakness and weakness.    OBJECTIVE:  BP (!) 149/77 (BP Location: Left Arm)   Pulse 91   Temp 98.3 F (36.8 C) (Oral)   Resp 18   SpO2 96%   Wt Readings from Last 3 Encounters:  04/07/17 114 lb 12 oz (52.1 kg)  09/29/16 115 lb 3.2 oz (52.3 kg)  04/22/16 115 lb 6.4 oz (52.3 kg)   Temp Readings from Last 3 Encounters:  10/04/17 98.3 F (36.8 C) (Oral)  11/03/15 98.4 F (36.9 C) (Oral)  12/28/14 97.8 F (36.6 C) (Oral)   BP Readings from Last 3 Encounters:  10/04/17 (!) 149/77  04/07/17 126/70  09/29/16 128/62   Pulse Readings from Last 3 Encounters:  10/04/17 91  04/07/17 84  09/29/16 84     Physical Exam  Constitutional: She is oriented to person, place, and time. She appears well-nourished. No distress.  Eyes: Pupils are equal, round, and reactive to light. EOM are normal.  Cardiovascular: Normal rate, regular rhythm, S1 normal, S2 normal, normal heart sounds and intact distal pulses. Exam reveals no gallop, no friction rub and no decreased pulses.  No murmur heard. Pulses:      Popliteal pulses are 2+ on the left side.       Dorsalis pedis pulses are 2+ on the left side.       Posterior tibial pulses are 2+ on  the left side.  Pulmonary/Chest: Effort normal. No stridor. No respiratory distress. She has no wheezes. She has no rales.  Abdominal: She exhibits no distension.  Musculoskeletal: She exhibits edema (Trace edema on left side) and tenderness (about the greater trochanter and down the proximal third of the lateral femer.  ).       Back:        Legs: Neurological: She is alert and oriented to person, place, and time. No cranial nerve deficit. Gait normal.  Skin: Skin is dry. She is not diaphoretic.  Psychiatric: She has a normal mood and affect.  Vitals reviewed.   No results found for this or any previous visit (from the past 72 hour(s)).  Dg Knee Complete 4 Views Left  Result Date: 10/04/2017 CLINICAL DATA:  Left hip and left knee pain with some swelling; pt denies fall or injury. Hx of osteoporosis, osteoarthritis, and femoral head surgery w/3 screws. Pain in left hip originating around greater trochanter which radiates into femur. EXAM: LEFT KNEE - COMPLETE 4+ VIEW COMPARISON:  None. FINDINGS: No evidence of fracture, dislocation, or joint effusion. No evidence of arthropathy or other focal bone abnormality. Soft tissues are unremarkable. IMPRESSION: Negative. Electronically Signed   By: Norva Pavlov M.D.   On: 10/04/2017 17:22   Dg Hip Unilat With Pelvis 2-3 Views Left  Result Date: 10/04/2017 CLINICAL DATA:  Left hip and left knee pain with some swelling; pt denies fall or injury. Hx of osteoporosis, osteoarthritis, and femoral head surgery w/3 screws. Pain in left hip originating around greater trochanter which radiates into femur EXAM: DG HIP (WITH OR WITHOUT PELVIS) 2-3V LEFT COMPARISON:  11/22/2012 FINDINGS: Three Knowles pins traverse the LEFT femoral neck. There is no acute fracture or dislocation. There are femoral head osteophytes. Pelvis is intact. IMPRESSION: No evidence for acute  abnormality. Electronically Signed   By: Norva Pavlov M.D.   On: 10/04/2017 17:21    ASSESSMENT AND PLAN:  Orders Placed This Encounter  Procedures  . DG Hip Unilat With Pelvis 2-3 Views Left    Standing Status:   Standing    Number of Occurrences:   1    Order Specific Question:   Symptom/Reason for Exam    Answer:   Leg pain, anterior [[id number]]    Order Specific Question:   Call Results- Best Contact Number?    Answer:   Hip,  thigh, knee pain.  Hx of osteoporosis.   History of instrumentation.    Order Specific Question:   Radiology Contrast Protocol - do NOT remove file path    Answer:   [email or web address]  . DG Knee Complete 4 Views Left    Standing Status:   Standing    Number of Occurrences:   1    Order Specific Question:   Reason for Exam (SYMPTOM  OR DIAGNOSIS REQUIRED)    Answer:   Hip, thigh, knee pain.     Left leg pain: She has existing arthritis this appears to be flaring, likely secondary to physical activity.  Her leg swelling is physiologic at best, likely secondary to proixmal inflammation.  No Homan's sign, calf tenderness, leg erythema. Pulses intact at the DP, PT and popliteal fossa.   Leg pain, anterior - Plan: DG Hip Unilat With Pelvis 2-3 Views Left, DG Hip Unilat With Pelvis 2-3 Views Left, CANCELED: DG Femur Min 2 Views Left, CANCELED: DG Femur Min 2 Views Left  Leg pain, anterior - Plan: DG Hip Unilat With Pelvis 2-3 Views  Left, DG Hip Unilat With Pelvis 2-3 Views Left, CANCELED: DG Femur Min 2 Views Left, CANCELED: DG Femur Min 2 Views Left      The patient is advised to call or return to clinic if she does not see an improvement in symptoms, or to seek the care of the closest emergency department if she worsens with the above plan.   Deliah Boston, MHS, PA-C 10/04/2017 5:50 PM    Ofilia Neas, PA-C 10/04/17 1758

## 2017-10-05 ENCOUNTER — Ambulatory Visit: Payer: Medicare Other | Admitting: Emergency Medicine

## 2017-10-13 ENCOUNTER — Other Ambulatory Visit: Payer: Self-pay | Admitting: Physician Assistant

## 2018-01-06 ENCOUNTER — Ambulatory Visit (INDEPENDENT_AMBULATORY_CARE_PROVIDER_SITE_OTHER)
Admission: RE | Admit: 2018-01-06 | Discharge: 2018-01-06 | Disposition: A | Discharge status: Home / Self Care | Payer: Medicare Other | Source: Ambulatory Visit | Attending: Emergency Medicine | Admitting: Emergency Medicine

## 2018-01-06 ENCOUNTER — Ambulatory Visit: Payer: Medicare Other | Admitting: Emergency Medicine

## 2018-01-06 ENCOUNTER — Encounter: Payer: Self-pay | Admitting: Emergency Medicine

## 2018-01-06 VITALS — BP 116/60 | HR 87 | Wt 114.0 lb

## 2018-01-06 DIAGNOSIS — K219 Gastro-esophageal reflux disease without esophagitis: Secondary | ICD-10-CM

## 2018-01-06 DIAGNOSIS — G4733 Obstructive sleep apnea (adult) (pediatric): Secondary | ICD-10-CM

## 2018-01-06 DIAGNOSIS — R0600 Dyspnea, unspecified: Secondary | ICD-10-CM

## 2018-01-06 DIAGNOSIS — J984 Other disorders of lung: Secondary | ICD-10-CM | POA: Diagnosis not present

## 2018-01-06 MED ORDER — ALBUTEROL SULFATE HFA 108 (90 BASE) MCG/ACT IN AERS: 2.0000 | INHALATION_SPRAY | RESPIRATORY_TRACT | 5 refills | Status: AC | PRN

## 2018-01-06 NOTE — Patient Instructions (Addendum)
We will perform a CXR today You can re-try using albuterol 2 puffs up to every 4 hours if needed for shortness of breath.  Please call us to let us know if you have benefited from the albuterol.  Follow with Dr Delton CoombesByrum as needed.

## 2018-01-06 NOTE — Assessment & Plan Note (Signed)
Moderately controlled on current regimen.  Continue scheduled Protonix, antacid as needed.

## 2018-01-06 NOTE — Progress Notes (Signed)
Subjective:    Patient ID: Kim Colon, female DOB: 1932/10/21, 84 y.o. MRN: [id number]  HPI 84 yo never smoker with hx compression fx's, GERD, HTN, hyperlipidemia. Per Dr Alver FisherShaw's notes, also with a pulmonary nodule from 10/2004. She has been having some episodes of sudden awakening, feeling like she isn't moving air. This started happening about a year ago. She does have a hx of reflux a moderate sized hiatal hernia, reflux has come into her mouth at night - but the episodes now don't feel the same, aren't associated with a sour taste, etc. She has SOB with exertion that has evolved gradually. Her PFT in 10/2010 showed no AFL, some mild restriction, decreased diffusion that corrects for Va. Her husband says she snores. She is tired during the day - never refreshed in the am.    ROV 01/05/18 --follow-up visit for 84 year old woman with restrictive lung disease in the setting of kyphoscoliosis, a hiatal hernia, compression fractures.  She also has a history of significant GERD, hypertension, hyperlipidemia, obstructive sleep apnea for which she has refused CPAP therapy.  She intermittently uses a neck collar for support. She has some dysphagia, some impaired transit. She has occasional GERD sx. She has experienced some increased dyspnea, her breathing "cuts off'. Sometimes hears UA noise. She is able to exert, walk w a walker. She is on protonix bid, uses tums prn.     Vitals:   01/06/18 1508  BP: 116/60  Pulse: 87  SpO2: 98%  Weight: 114 lb (51.7 kg)   Gen: Pleasant, kyphotic, in no distress,  normal affect, in wheelchair  ENT: No lesions,  mouth clear,  oropharynx clear, no postnasal drip  Neck: No JVD, no stridor  Lungs: No use of accessory muscles, Bibasilar inspiratory crackles, decreased at both bases  Cardiovascular: RRR, heart sounds normal, no murmur or gallops, no peripheral edema  Musculoskeletal: No deformities, no cyanosis or clubbing  Neuro: alert, non focal  Skin:  Warm, no lesions or rashes   Restrictive lung disease Progressive exertional dyspnea due in large part to her restrictive lung disease from her kyphoscoliosis, hiatal hernia.  It sounds like she also may have some intermittent vocal cord dysfunction and upper airway obstruction again impacted by GERD.  We will continue her current GERD therapy.  I do not see much evidence here for obstruction based on her pulmonary function testing but I think will be reasonable to give her albuterol to see if she benefits.  I explained to her the difference between upper and lower airways obstruction.  She will try the albuterol.   GERD (gastroesophageal reflux disease) Moderately controlled on current regimen.  Continue scheduled Protonix, antacid as needed.   Obstructive sleep apnea Not currently treated  Levy Pupaobert Byrum, MD, PhD 01/06/2018, 3:32 PM [facility] [phone] or if no answer [phone]

## 2018-01-06 NOTE — Assessment & Plan Note (Signed)
Not currently treated. ?

## 2018-01-06 NOTE — Assessment & Plan Note (Signed)
Progressive exertional dyspnea due in large part to her restrictive lung disease from her kyphoscoliosis, hiatal hernia.  It sounds like she also may have some intermittent vocal cord dysfunction and upper airway obstruction again impacted by GERD.  We will continue her current GERD therapy.  I do not see much evidence here for obstruction based on her pulmonary function testing but I think will be reasonable to give her albuterol to see if she benefits.  I explained to her the difference between upper and lower airways obstruction.  She will try the albuterol.

## 2018-04-09 ENCOUNTER — Encounter: Payer: Self-pay | Admitting: Cardiovascular Disease

## 2018-04-09 ENCOUNTER — Other Ambulatory Visit: Payer: Self-pay | Admitting: Physician Assistant

## 2018-04-09 ENCOUNTER — Ambulatory Visit: Payer: Medicare Other | Admitting: Cardiovascular Disease

## 2018-04-09 VITALS — BP 94/64 | HR 78 | Ht 59.0 in | Wt 113.0 lb

## 2018-04-09 DIAGNOSIS — I10 Essential (primary) hypertension: Secondary | ICD-10-CM

## 2018-04-09 NOTE — Progress Notes (Signed)
Cardiology Office Note   Date:  04/09/2018   ID:  Kim Colon, DOB 1933/05/12, MRN [id number]  PCP:  Kim Clan, MD  Cardiologist: Kim Clement MD -    Chief Complaint  Patient presents with  . Hypertension   Problem List 1. Palpitations 2. Essential HTN 3.    Notes from [name] is a 84 y.o. female who presents for  Follow-up office visit.  Kim Colon is a 84 y.o. female with history of HTN, HLD, GERD, Barrett's esophagus, significant kyphosis who presents for post-hospital follow-up.  She is a medical patient of Kim Colon.   Several months ago she was admitted for left sided CP associated with SOB and fatigue for several days. She also had reported a HR monitor showing HR 120s but had not felt any irregular HR. Initial EKG showed sinus tach 106 with nonspecific abnormalities. She r/o for MI and TSH was normal. CMET was normal except hypokalemia, hypomagnesemia. 2D Echo 12/28/14: EF 55-60%, no RWMA, normal diastolic parameters. It was felt since echo was normal, would reserve stress testing for recurrent CP.  On 01/08/15 she had a 48 hour Holter monitor which was normal. On 01/22/15 she underwent a Myoview stress test which showed an ejection fraction of 92% and was low risk and showed no ischemia. She was empirically placed on metoprolol and has felt better with less palpitations and less forceful heart action.  Nov.  28, 2017 Kim Colon is seen for the first time today .   Transfer from [place]. Has hx of palpitations -   Nov. 15 , 2019 Seen by Kim Aus, PA  last year  [facility] with a walker ,   In a wheelchair here   Occasional palpitations   Past Medical History:  Diagnosis Date  . Arthritis   . Barrett esophagus   . Chronic back pain   . Chronic epigastric pain   . Colon polyps   . Daily headache   . Depression   . GERD (gastroesophageal reflux disease)   . Hip fracture (HCC)    left  . History of hiatal hernia   . HTN  (hypertension)   . Hyperlipemia   . Kyphosis   . Osteoporosis with fracture    T12,T11, T10  . Pulmonary nodule 2006  . Restrictive lung disease    "because of the way my back is"  . Shingles   . Sleep apnea    "went thru study; found evidence of sleep apnea; never got a mask" (12/26/2014)  . Vitamin D deficiency disease     Past Surgical History:  Procedure Laterality Date  . APPENDECTOMY    . BUNIONECTOMY Right 08/2010  . CATARACT EXTRACTION W/ INTRAOCULAR LENS  IMPLANT, BILATERAL Bilateral   . CHOLECYSTECTOMY OPEN    . DILATION AND CURETTAGE OF UTERUS  1060's   "might have done one when I miscarried"  . ESOPHAGOGASTRODUODENOSCOPY (EGD) WITH ESOPHAGEAL DILATION  X 2?  . FRACTURE SURGERY    . ORIF HIP FRACTURE Left 10/2009  . TOE SURGERY Right 08/2010   "took some bones out of the 2 toes next to my big toe"  . TONSILLECTOMY    . TOTAL ABDOMINAL HYSTERECTOMY     w/BSO  . TUBAL LIGATION  1973  . VERTEBROPLASTY     T8     Current Outpatient Medications  Medication Sig Dispense Refill  . acetaminophen (TYLENOL) 325 MG tablet Take 325 mg by mouth 3 (  three) times daily.    Marland Kitchen. albuterol (PROAIR HFA) 108 (90 Base) MCG/ACT inhaler Inhale 2 puffs into the lungs every 4 (four) hours as needed for wheezing or shortness of breath. 1 Inhaler 5  . calcium carbonate (TUMS - DOSED IN MG ELEMENTAL CALCIUM) 500 MG chewable tablet Chew 1 tablet by mouth daily as needed for heartburn.    . Calcium Carbonate-Vitamin D (CALCIUM-D) 600-400 MG-UNIT TABS Take 1 capsule by mouth daily at 12 noon.     . Cholecalciferol (VITAMIN D3) 1000 UNITS CAPS Take 1 capsule by mouth daily at 12 noon.     . hydrochlorothiazide (HYDRODIURIL) 25 MG tablet Take 1 tablet (25 mg total) by mouth daily. 90 tablet 1  . metoprolol succinate (TOPROL-XL) 25 MG 24 hr tablet Take 1 tablet (25 mg total) daily by mouth. 90 tablet 3  . Multiple Vitamins-Minerals (COMPLETE MULTIVITAMIN/MINERAL PO) Take 1 capsule by mouth daily at 12  noon.     Marland Kitchen. NASAL SALINE NA Place 1 spray into the nose daily as needed (dry nose).    . pantoprazole (PROTONIX) 40 MG tablet Take 40 mg by mouth 2 (two) times daily.  2  . Polyvinyl Alcohol-Povidone (REFRESH OP) Place 1 drop into both eyes daily as needed (dry eyes).    . potassium chloride (KLOR-CON M10) 10 MEQ tablet Take 1 tablet (10 mEq total) by mouth daily. 90 tablet 3  . pyridOXINE (VITAMIN B-6) 100 MG tablet Take 100 mg by mouth daily at 12 noon.     Marland Kitchen. Spacer/Aero-Holding Chambers (AEROCHAMBER MV) inhaler Use as instructed 1 each 0  . valsartan (DIOVAN) 160 MG tablet Take 160 mg by mouth daily.  11  . vitamin B-12 (CYANOCOBALAMIN) 500 MCG tablet Take 500 mcg by mouth daily at 12 noon.      No current facility-administered medications for this visit.     Allergies:   Duloxetine; Amlodipine; and Biaxin [clarithromycin]    Social History:  The patient  reports that she has never smoked. She has never used smokeless tobacco. She reports that she does not drink alcohol or use drugs.   Family History:  The patient's family history includes Cancer in her father; Diabetes type II in her sister; Heart attack in her brother and mother; Heart failure in her father; Osteopenia in her daughter and daughter; Parkinsonism in her brother; Stroke in her brother.    ROS:  Please see the history of present illness.   Otherwise, review of systems are positive for none.   All other systems are reviewed and negative.    PHYSICAL EXAM: VS:  BP 94/64   Pulse 78   Ht 4\' 11"  (1.499 m)   Wt 113 lb (51.3 kg)   SpO2 97%   BMI 22.82 kg/m  , BMI Body mass index is 22.82 kg/m. GEN: Well nourished, well developed, in no acute distress.  moderate kyphosis  HEENT: normal  Neck: no JVD, carotid bruits, or masses Cardiac: RRR; no murmurs, rubs, or gallops,no edema  Respiratory:  clear to auscultation bilaterally, normal work of breathing GI: soft, nontender, nondistended, + BS Kim: severe kyphosis,   Examined in a wheelchair Skin: warm and dry, no rash Neuro:  Strength and sensation are intact Psych: euthymic mood, full affect   EKG:   Nov. 15 ,2019:  NSR at 7678.  Normal ECG .    Recent Labs: No results found for requested labs within last 8760 hours.    Lipid Panel No results found for: CHOL,  TRIG, HDL, CHOLHDL, VLDL, LDLCALC, LDLDIRECT    Wt Readings from Last 3 Encounters:  04/09/18 113 lb (51.3 kg)  01/06/18 114 lb (51.7 kg)  04/07/17 114 lb 12 oz (52.1 kg)       ASSESSMENT AND PLAN:  1.  chest pain felt to be noncardiac. Normal Holter monitor.  Normal Lexiscan Myoview stress test. Follow up as needed   2. Palpitations -   Stable    3.  4. Essential HTN -   Follow up with Dr. Clelia Croft Will ask him to assume managemet of her meds  I will see her as needed    5.  kyphosis   Current medicines are reviewed at length with the patient today.  The patient does not have concerns regarding medicines.  The following changes have been made:  no change   Will follow up with her as needed     Kristeen Miss, MD  04/09/2018 11:54 AM    [facility] [address] [place],  [address] [place], [place]  [address] Pager [phone] Phone: [phone]; Fax: [phone]

## 2018-04-09 NOTE — Patient Instructions (Addendum)
Medication Instructions:  Your physician recommends that you continue on your current medications as directed. Please refer to the Current Medication list given to you today.  If you need a refill on your cardiac medications before your next appointment, please call your pharmacy.    Lab work: None Ordered    Testing/Procedures: None Ordered   Follow-Up: At [facility], you and your health needs are our priority.  As part of our continuing mission to provide you with exceptional heart care, we have created designated Provider Care Teams.  These Care Teams include your primary Cardiologist (physician) and Advanced Practice Providers (APPs -  Physician Assistants and Nurse Practitioners) who all work together to provide you with the care you need, when you need it. You will need a follow up appointment in:   as needed.  You may see Philip Nahser, MD or one of the following Advanced Practice Providers on your designated Care Team: Scott Weaver, PA-C Vin Bhagat, PA-C . Janine Hammond, NP   

## 2018-04-24 ENCOUNTER — Other Ambulatory Visit: Payer: Self-pay | Admitting: Physician Assistant

## 2018-04-24 ENCOUNTER — Other Ambulatory Visit: Payer: Self-pay | Admitting: Cardiovascular Disease

## 2018-06-16 ENCOUNTER — Other Ambulatory Visit: Payer: Self-pay | Admitting: Physician Assistant

## 2019-06-07 ENCOUNTER — Other Ambulatory Visit: Payer: Self-pay | Admitting: Physician Assistant

## 2019-06-30 ENCOUNTER — Other Ambulatory Visit: Payer: Self-pay | Admitting: Physician Assistant

## 2019-07-15 ENCOUNTER — Other Ambulatory Visit: Payer: Self-pay | Admitting: Physician Assistant

## 2019-07-26 ENCOUNTER — Other Ambulatory Visit: Payer: Self-pay | Admitting: Cardiovascular Disease

## 2019-08-01 ENCOUNTER — Telehealth: Payer: Self-pay | Admitting: Cardiovascular Disease

## 2019-08-01 NOTE — Telephone Encounter (Signed)
Patient wanted to know if her daughter would be able to come with her to her appointment. She has some mobility issues and does not feel comfortable walking on her own/ The patiet has an answering machine, so if she is not available, please leave a detailed message on her answering machine

## 2019-08-01 NOTE — Telephone Encounter (Signed)
I s/w the pt and assured her it is fine for her daughter Herbert Seta to come to the appt as she has balance issues.

## 2019-08-02 ENCOUNTER — Other Ambulatory Visit: Payer: Self-pay | Admitting: Cardiovascular Disease

## 2019-08-03 ENCOUNTER — Encounter: Payer: Self-pay | Admitting: Cardiovascular Disease

## 2019-08-03 NOTE — Progress Notes (Signed)
Cardiology Office Note   Date:  08/03/2019   ID:  Kim Colon, DOB 22-Apr-1933, MRN [id number]  PCP:  Martha Clan, MD  Cardiologist: Cassell Clement MD - Tanairi Cypert   Chief Complaint  Patient presents with  . Hypertension  . Palpitations   Problem List 1. Palpitations 2. Essential HTN 3.    Notes from [facility] Kim Colon is a 86 y.o. female who presents for  Follow-up office visit.  Kim Colon is a 86 y.o. female with history of HTN, HLD, GERD, Barrett's esophagus, significant kyphosis who presents for post-hospital follow-up.  She is a medical patient of [name].   Several months ago she was admitted for left sided CP associated with SOB and fatigue for several days. She also had reported a HR monitor showing HR 120s but had not felt any irregular HR. Initial EKG showed sinus tach 106 with nonspecific abnormalities. She r/o for MI and TSH was normal. CMET was normal except hypokalemia, hypomagnesemia. 2D Echo 12/28/14: EF 55-60%, no RWMA, normal diastolic parameters. It was felt since echo was normal, would reserve stress testing for recurrent CP.  On 01/08/15 she had a 48 hour Holter monitor which was normal. On 01/22/15 she underwent a Myoview stress test which showed an ejection fraction of 92% and was low risk and showed no ischemia. She was empirically placed on metoprolol and has felt better with less palpitations and less forceful heart action.  Nov.  28, 2017 [name] is seen for the first time today .   Transfer from [place]. Has hx of palpitations -   Nov. 15 , 2019 Seen by Chelsea Aus, PA  last year  [facility] with a walker ,   In a wheelchair here   Occasional palpitations  August 04, 2019:  Kim Colon is seen today for a follow-up visit.  She has a history of noncardiac chest pain.  She has a history of palpitations. Very frail female. Examined in the wheelchair  Has a hx of HTN  - is on HCTZ and metoprolol   Is very dizzy now.    walks with a  walker at home . Is very short of breath  Hs leg pain , shoulder pain .  Lots of arthritis pain  No CP , sometimes a little tightness  Lots of headaches,     Past Medical History:  Diagnosis Date  . Arthritis   . Barrett esophagus   . Chronic back pain   . Chronic epigastric pain   . Colon polyps   . Daily headache   . Depression   . GERD (gastroesophageal reflux disease)   . Hip fracture (HCC)    left  . History of hiatal hernia   . HTN (hypertension)   . Hyperlipemia   . Kyphosis   . Osteoporosis with fracture    T12,T11, T10  . Pulmonary nodule 2006  . Restrictive lung disease    "because of the way my back is"  . Shingles   . Sleep apnea    "went thru study; found evidence of sleep apnea; never got a mask" (12/26/2014)  . Vitamin D deficiency disease     Past Surgical History:  Procedure Laterality Date  . APPENDECTOMY    . BUNIONECTOMY Right 08/2010  . CATARACT EXTRACTION W/ INTRAOCULAR LENS  IMPLANT, BILATERAL Bilateral   . CHOLECYSTECTOMY OPEN    . DILATION AND CURETTAGE OF UTERUS  1060's   "might have done one  when I miscarried"  . ESOPHAGOGASTRODUODENOSCOPY (EGD) WITH ESOPHAGEAL DILATION  X 2?  . FRACTURE SURGERY    . ORIF HIP FRACTURE Left 10/2009  . TOE SURGERY Right 08/2010   "took some bones out of the 2 toes next to my big toe"  . TONSILLECTOMY    . TOTAL ABDOMINAL HYSTERECTOMY     w/BSO  . TUBAL LIGATION  1973  . VERTEBROPLASTY     T8     Current Outpatient Medications  Medication Sig Dispense Refill  . acetaminophen (TYLENOL) 325 MG tablet Take 325 mg by mouth 3 (three) times daily.    Marland Kitchen albuterol (PROAIR HFA) 108 (90 Base) MCG/ACT inhaler Inhale 2 puffs into the lungs every 4 (four) hours as needed for wheezing or shortness of breath. 1 Inhaler 5  . calcium carbonate (TUMS - DOSED IN MG ELEMENTAL CALCIUM) 500 MG chewable tablet Chew 1 tablet by mouth daily as needed for heartburn.    . Calcium Carbonate-Vitamin D (CALCIUM-D) 600-400 MG-UNIT  TABS Take 1 capsule by mouth daily at 12 noon.     . Cholecalciferol (VITAMIN D3) 1000 UNITS CAPS Take 1 capsule by mouth daily at 12 noon.     . hydrochlorothiazide (HYDRODIURIL) 25 MG tablet TAKE 1 TABLET BY MOUTH EVERY DAY 90 tablet 3  . metoprolol succinate (TOPROL-XL) 25 MG 24 hr tablet TAKE 1 TABLET (25 MG TOTAL) DAILY BY MOUTH. 90 tablet 1  . Multiple Vitamins-Minerals (COMPLETE MULTIVITAMIN/MINERAL PO) Take 1 capsule by mouth daily at 12 noon.     Marland Kitchen NASAL SALINE NA Place 1 spray into the nose daily as needed (dry nose).    . pantoprazole (PROTONIX) 40 MG tablet Take 40 mg by mouth 2 (two) times daily.  2  . Polyvinyl Alcohol-Povidone (REFRESH OP) Place 1 drop into both eyes daily as needed (dry eyes).    . potassium chloride (KLOR-CON M10) 10 MEQ tablet Take 1 tablet (10 mEq total) by mouth daily. 30 tablet 0  . pyridOXINE (VITAMIN B-6) 100 MG tablet Take 100 mg by mouth daily at 12 noon.     Marland Kitchen Spacer/Aero-Holding Chambers (AEROCHAMBER MV) inhaler Use as instructed 1 each 0  . valsartan (DIOVAN) 160 MG tablet Take 160 mg by mouth daily.  11  . vitamin B-12 (CYANOCOBALAMIN) 500 MCG tablet Take 500 mcg by mouth daily at 12 noon.      No current facility-administered medications for this visit.    Allergies:   Duloxetine, Amlodipine, and Biaxin [clarithromycin]    Social History:  The patient  reports that she has never smoked. She has never used smokeless tobacco. She reports that she does not drink alcohol or use drugs.   Family History:  The patient's family history includes Cancer in her father; Diabetes type II in her sister; Heart attack in her brother and mother; Heart failure in her father; Osteopenia in her daughter and daughter; Parkinsonism in her brother; Stroke in her brother.    ROS:  Please see the history of present illness.   Otherwise, review of systems are positive for none.   All other systems are reviewed and negative.    Physical Exam: There were no vitals  taken for this visit.  GEN:  Chronically ill appearing ,   Examined in wheelchair.   Severe kyphosis  HEENT: Normal NECK: No JVD; No carotid bruits LYMPHATICS: No lymphadenopathy CARDIAC: RRR ,  Soft systolic mummur  RESPIRATORY:  Clear to auscultation without rales, wheezing or rhonchi  ABDOMEN: Soft,  non-tender, non-distended MUSCULOSKELETAL:  No edema; No deformity  SKIN: Warm and dry NEUROLOGIC:  Alert and oriented x 3    EKG:   August 04, 2019:   NSR at 82.  No ST or T wave changes.   Recent Labs: No results found for requested labs within last 8760 hours.    Lipid Panel No results found for: CHOL, TRIG, HDL, CHOLHDL, VLDL, LDLCALC, LDLDIRECT    Wt Readings from Last 3 Encounters:  04/09/18 113 lb (51.3 kg)  01/06/18 114 lb (51.7 kg)  04/07/17 114 lb 12 oz (52.1 kg)       ASSESSMENT AND PLAN:   chest pain felt to be noncardiac,  She has severe kyphosis which limits her chest expansion  Will have her continue to follow up with her primary md   1. Palpitations -   Continues to have rare palpitations     2.  Essential HTN -    mananged by her primary MD   I will see her as needed    3.  kyphosis   Current medicines are reviewed at length with the patient today.  The patient does not have concerns regarding medicines.  The following changes have been made:  no change       Mertie Moores, MD  08/03/2019 8:55 PM    [address],  [place], [place]  [address] Pager [phone] Phone: [phone]; Fax: [phone]

## 2019-08-04 ENCOUNTER — Other Ambulatory Visit: Payer: Self-pay

## 2019-08-04 ENCOUNTER — Encounter: Payer: Self-pay | Admitting: Cardiovascular Disease

## 2019-08-04 ENCOUNTER — Ambulatory Visit: Payer: Medicare Other | Admitting: Cardiovascular Disease

## 2019-08-04 VITALS — BP 146/78 | HR 84 | Ht 59.0 in | Wt 115.0 lb

## 2019-08-04 DIAGNOSIS — I10 Essential (primary) hypertension: Secondary | ICD-10-CM | POA: Diagnosis not present

## 2019-08-04 DIAGNOSIS — R0600 Dyspnea, unspecified: Secondary | ICD-10-CM | POA: Diagnosis not present

## 2019-08-04 DIAGNOSIS — R0609 Other forms of dyspnea: Secondary | ICD-10-CM

## 2019-08-04 NOTE — Patient Instructions (Signed)
Medication Instructions:  The current medical regimen is effective;  continue present plan and medications.  *If you need a refill on your cardiac medications before your next appointment, please call your pharmacy*  Follow-Up: Follow up as needed with Dr Elease Hashimoto.  Thank you for choosing [facility]!!

## 2019-08-10 NOTE — Addendum Note (Signed)
Addended by: Melanee Spry on: 08/10/2019 04:15 PM   Modules accepted: Orders

## 2019-08-29 ENCOUNTER — Other Ambulatory Visit: Payer: Self-pay | Admitting: Cardiovascular Disease

## 2020-01-15 ENCOUNTER — Other Ambulatory Visit: Payer: Self-pay | Admitting: Cardiovascular Disease

## 2020-02-20 ENCOUNTER — Other Ambulatory Visit: Payer: Self-pay

## 2020-02-20 ENCOUNTER — Encounter (HOSPITAL_BASED_OUTPATIENT_CLINIC_OR_DEPARTMENT_OTHER): Payer: Self-pay | Admitting: Emergency Medicine

## 2020-02-20 ENCOUNTER — Emergency Department (HOSPITAL_BASED_OUTPATIENT_CLINIC_OR_DEPARTMENT_OTHER)
Admission: EM | Admit: 2020-02-20 | Discharge: 2020-02-20 | Disposition: A | Discharge status: Home / Self Care | Payer: Medicare Other | Attending: Emergency Medicine | Admitting: Emergency Medicine

## 2020-02-20 ENCOUNTER — Emergency Department (HOSPITAL_BASED_OUTPATIENT_CLINIC_OR_DEPARTMENT_OTHER): Payer: Medicare Other

## 2020-02-20 DIAGNOSIS — Y9289 Other specified places as the place of occurrence of the external cause: Secondary | ICD-10-CM | POA: Insufficient documentation

## 2020-02-20 DIAGNOSIS — W19XXXA Unspecified fall, initial encounter: Secondary | ICD-10-CM | POA: Diagnosis not present

## 2020-02-20 DIAGNOSIS — S3992XA Unspecified injury of lower back, initial encounter: Secondary | ICD-10-CM | POA: Diagnosis present

## 2020-02-20 DIAGNOSIS — Z79899 Other long term (current) drug therapy: Secondary | ICD-10-CM | POA: Insufficient documentation

## 2020-02-20 DIAGNOSIS — Y9301 Activity, walking, marching and hiking: Secondary | ICD-10-CM | POA: Diagnosis not present

## 2020-02-20 DIAGNOSIS — I6782 Cerebral ischemia: Secondary | ICD-10-CM | POA: Insufficient documentation

## 2020-02-20 DIAGNOSIS — I10 Essential (primary) hypertension: Secondary | ICD-10-CM | POA: Diagnosis not present

## 2020-02-20 DIAGNOSIS — S32009A Unspecified fracture of unspecified lumbar vertebra, initial encounter for closed fracture: Secondary | ICD-10-CM | POA: Diagnosis not present

## 2020-02-20 DIAGNOSIS — S22080A Wedge compression fracture of T11-T12 vertebra, initial encounter for closed fracture: Secondary | ICD-10-CM | POA: Diagnosis not present

## 2020-02-20 MED ORDER — LIDOCAINE 5 % EX PTCH
1.0000 | MEDICATED_PATCH | CUTANEOUS | Status: DC
  Administered 2020-02-20: 1 via TRANSDERMAL
  Filled 2020-02-20: qty 1

## 2020-02-20 MED ORDER — ACETAMINOPHEN 500 MG PO TABS
1000.0000 mg | ORAL_TABLET | Freq: Once | ORAL | Status: AC
  Administered 2020-02-20: 1000 mg via ORAL
  Filled 2020-02-20: qty 2

## 2020-02-20 MED ORDER — IBUPROFEN 400 MG PO TABS
600.0000 mg | ORAL_TABLET | Freq: Once | ORAL | Status: AC
  Administered 2020-02-20: 600 mg via ORAL
  Filled 2020-02-20: qty 1

## 2020-02-20 NOTE — Progress Notes (Signed)
Orthopedic Tech Progress Note Patient Details:  Kim Colon 07/22/1932 [id number]  Patient ID: Kim Colon, female   DOB: 23-Jul-1932, 86 y.o.   MRN: [id number] Called order into [facility].  Trinna Post 02/20/2020, 10:43 PM

## 2020-02-20 NOTE — ED Provider Notes (Signed)
[facility] EMERGENCY DEPARTMENT Provider Note   CSN: [id number] Arrival date & time: 02/20/20  1712     History No chief complaint on file.   Kim Colon is a 86 y.o. female presenting for evaluation of back pain after fall.  Patient states today she was walking when she got dizzy, which is not atypical for her.  But then she fell backwards, landed on her mid back.  She has pain in the area, and off towards the right side.  She has minimal pain at rest, a lot of pain with movement.  She denies any numbness or tingling.  She did not hit her head or lose consciousness.  She is not on blood thinner.  She has taken Tylenol for pain, has not taken anything else.  She denies chest pain, shortness of breath, nausea, vomiting, loss of bowel bladder control.  She sees Dr. Danielle DessElsner with neurosurgery.  HPI     Past Medical History:  Diagnosis Date  . Arthritis   . Barrett esophagus   . Chronic back pain   . Chronic epigastric pain   . Colon polyps   . Daily headache   . Depression   . GERD (gastroesophageal reflux disease)   . Hip fracture (HCC)    left  . History of hiatal hernia   . HTN (hypertension)   . Hyperlipemia   . Kyphosis   . Osteoporosis with fracture    T12,T11, T10  . Pulmonary nodule 2006  . Restrictive lung disease    "because of the way my back is"  . Shingles   . Sleep apnea    "went thru study; found evidence of sleep apnea; never got a mask" (12/26/2014)  . Vitamin D deficiency disease     Patient Active Problem List   Diagnosis Date Noted  . Dyspnea on exertion   . Other fatigue   . Palpitations   . Fatigue 12/26/2014  . Chest pain 12/26/2014  . Restrictive lung disease 01/05/2012  . GERD (gastroesophageal reflux disease) 01/05/2012  . Obstructive sleep apnea 01/05/2012  . Hypertension 01/05/2012    Past Surgical History:  Procedure Laterality Date  . APPENDECTOMY    . BUNIONECTOMY Right 08/2010  . CATARACT EXTRACTION W/ INTRAOCULAR  LENS  IMPLANT, BILATERAL Bilateral   . CHOLECYSTECTOMY OPEN    . DILATION AND CURETTAGE OF UTERUS  1060's   "might have done one when I miscarried"  . ESOPHAGOGASTRODUODENOSCOPY (EGD) WITH ESOPHAGEAL DILATION  X 2?  . FRACTURE SURGERY    . ORIF HIP FRACTURE Left 10/2009  . TOE SURGERY Right 08/2010   "took some bones out of the 2 toes next to my big toe"  . TONSILLECTOMY    . TOTAL ABDOMINAL HYSTERECTOMY     w/BSO  . TUBAL LIGATION  1973  . VERTEBROPLASTY     T8     OB History   No obstetric history on file.     Family History  Problem Relation Age of Onset  . Heart failure Father   . Cancer Father        prostate  . Heart attack Mother   . Parkinsonism Brother   . Diabetes type II Sister   . Osteopenia Daughter   . Osteopenia Daughter   . Stroke Brother   . Heart attack Brother     Social History   Tobacco Use  . Smoking status: Never Smoker  . Smokeless tobacco: Never Used  Substance Use Topics  .  Alcohol use: No  . Drug use: No    Home Medications Prior to Admission medications   Medication Sig Start Date End Date Taking? Authorizing Provider  acetaminophen (TYLENOL) 325 MG tablet Take 325 mg by mouth 3 (three) times daily.    [provider]  albuterol (PROAIR HFA) 108 (90 Base) MCG/ACT inhaler Inhale 2 puffs into the lungs every 4 (four) hours as needed for wheezing or shortness of breath. 01/06/18   Leslye Peer, MD  calcium carbonate (TUMS - DOSED IN MG ELEMENTAL CALCIUM) 500 MG chewable tablet Chew 1 tablet by mouth daily as needed for heartburn.    [provider]  Calcium Carbonate-Vitamin D (CALCIUM-D) 600-400 MG-UNIT TABS Take 1 capsule by mouth daily at 12 noon.     [provider]  Cholecalciferol (VITAMIN D3) 1000 UNITS CAPS Take 1 capsule by mouth daily at 12 noon.     [provider]  hydrochlorothiazide (HYDRODIURIL) 12.5 MG tablet Take 12.5 mg by mouth daily. 07/06/19   [provider]  KLOR-CON M10  10 MEQ tablet TAKE 1 TABLET BY MOUTH EVERY DAY 08/29/19   Nahser, Deloris Ping, MD  meclizine (ANTIVERT) 25 MG tablet Take 25 mg by mouth every 8 (eight) hours as needed. 07/06/19   [provider]  metoprolol succinate (TOPROL-XL) 25 MG 24 hr tablet TAKE 1 TABLET BY MOUTH EVERY DAY 01/17/20   Nahser, Deloris Ping, MD  Multiple Vitamins-Minerals (COMPLETE MULTIVITAMIN/MINERAL PO) Take 1 capsule by mouth daily at 12 noon.     [provider]  NASAL SALINE NA Place 1 spray into the nose daily as needed (dry nose).    [provider]  pantoprazole (PROTONIX) 40 MG tablet Take 40 mg by mouth 2 (two) times daily. 10/11/17   [provider]  Polyvinyl Alcohol-Povidone (REFRESH OP) Place 1 drop into both eyes daily as needed (dry eyes).    [provider]  pyridOXINE (VITAMIN B-6) 100 MG tablet Take 100 mg by mouth daily at 12 noon.     [provider]  Spacer/Aero-Holding Chambers (AEROCHAMBER MV) inhaler Use as instructed 04/16/16   Leslye Peer, MD  valsartan (DIOVAN) 160 MG tablet Take 160 mg by mouth daily. 12/11/17   [provider]  vitamin B-12 (CYANOCOBALAMIN) 500 MCG tablet Take 500 mcg by mouth daily at 12 noon.     [provider]    Allergies    Duloxetine, Amlodipine, and Biaxin [clarithromycin]  Review of Systems   Review of Systems  Musculoskeletal: Positive for back pain.  Neurological: Negative for numbness.  All other systems reviewed and are negative.   Physical Exam Updated Vital Signs BP (!) 151/80 (BP Location: Right Arm)   Pulse (!) 113   Temp 98.4 F (36.9 C) (Oral)   Resp 14   Ht 4\' 11"  (1.499 m)   Wt 51.7 kg   SpO2 96%   BMI 23.03 kg/m   Physical Exam Vitals and nursing note reviewed.  Constitutional:      General: She is not in acute distress.    Appearance: She is well-developed.     Comments: Appears nontoxic  HENT:     Head: Normocephalic and atraumatic.     Comments: No visible sign of  head trauma Eyes:     Extraocular Movements: Extraocular movements intact.     Conjunctiva/sclera: Conjunctivae normal.     Pupils: Pupils are equal, round, and reactive to light.  Neck:     Comments: No  TTP of midline C-spine. Cardiovascular:     Rate and Rhythm: Normal rate and regular rhythm.     Pulses: Normal pulses.  Pulmonary:     Effort: Pulmonary effort is normal.     Breath sounds: Normal breath sounds.     Comments: Clear lung sounds in all fields Chest:     Chest wall: No tenderness.  Abdominal:     General: There is no distension.     Palpations: There is no mass.     Tenderness: There is no abdominal tenderness. There is no guarding or rebound.  Musculoskeletal:        General: Tenderness present. Normal range of motion.     Cervical back: Normal range of motion.     Comments: Tenderness palpation of mid low back and right low back musculature.  No obvious step-offs or deformities.  Mild tenderness palpation of left lateral hip without obvious deformity. No tenderness palpation of the upper or lower extremities.  No obvious deformity.  Radial and pedal pulses 2+ bilaterally.  No saddle paresthesias.  Skin:    General: Skin is warm.     Capillary Refill: Capillary refill takes less than 2 seconds.     Findings: No rash.  Neurological:     Mental Status: She is alert and oriented to person, place, and time.     ED Results / Procedures / Treatments   Labs (all labs ordered are listed, but only abnormal results are displayed) Labs Reviewed - No data to display  EKG None  Radiology DG Ribs Unilateral W/Chest Right  Result Date: 02/20/2020 CLINICAL DATA:  Right rib pain after fall. EXAM: RIGHT RIBS AND CHEST - 3+ VIEW COMPARISON:  January 06, 2018. FINDINGS: No fracture or other bone lesions are seen involving the ribs. There is no evidence of pneumothorax or pleural effusion. Both lungs are clear. Heart size and mediastinal contours are within normal limits.  IMPRESSION: Negative. Electronically Signed   By: Lupita Raider M.D.   On: 02/20/2020 19:49   CT Head Wo Contrast  Result Date: 02/20/2020 CLINICAL DATA:  Dizziness and head trauma EXAM: CT HEAD WITHOUT CONTRAST TECHNIQUE: Contiguous axial images were obtained from the base of the skull through the vertex without intravenous contrast. COMPARISON:  10/06/2014 FINDINGS: Brain: There is no mass, hemorrhage or extra-axial collection. There is generalized atrophy without lobar predilection. Hypodensity of the white matter is most commonly associated with chronic microvascular disease. Vascular: No abnormal hyperdensity of the major intracranial arteries or dural venous sinuses. No intracranial atherosclerosis. Skull: The visualized skull base, calvarium and extracranial soft tissues are normal. Sinuses/Orbits: No fluid levels or advanced mucosal thickening of the visualized paranasal sinuses. No mastoid or middle ear effusion. The orbits are normal. IMPRESSION: Chronic microvascular ischemia and generalized atrophy without acute intracranial abnormality. Electronically Signed   By: Deatra Robinson M.D.   On: 02/20/2020 19:33   CT Lumbar Spine Wo Contrast  Result Date: 02/20/2020 CLINICAL DATA:  Fall.  Back pain EXAM: CT LUMBAR SPINE WITHOUT CONTRAST TECHNIQUE: Multidetector CT imaging of the lumbar spine was performed without intravenous contrast administration. Multiplanar CT image reconstructions were also generated. COMPARISON:  Lumbar spine radiographs 01/08/2004. CT lumbar spine 11/22/2012 FINDINGS: Segmentation: Normal Alignment: Normal Vertebrae: Mild chronic compression fractures L1 and L2 vertebral bodies unchanged Severe compression fracture of the T12 vertebral body which was not present previously. No definite acute cortical break is identified. Fracture of the right L3 transverse process with mild displacement. Paraspinal and  other soft tissues: Negative for paraspinous hematoma. No mass or  adenopathy. Mild atherosclerotic disease in the aorta. Left upper pole renal cyst, incompletely evaluated. Right lower pole renal cyst incompletely evaluated. Disc levels: T12-L1: Disc degeneration with disc bulging. Negative for stenosis L1-2: Negative L2-3: Negative for stenosis L3-4: Mild disc bulging.  Negative for stenosis L4-5: Mild disc bulging. Negative for stenosis. Mild facet degeneration L5-S1: Moderate facet degeneration.  Negative for stenosis. IMPRESSION: 1. Severe compression fracture T12 indeterminate age. This was not present on prior studies. Favor chronic fracture however if the patient has pain in this area, MRI would be helpful to date this fracture. In addition there are chronic fractures of L1 and L2 vertebral body 2. Acute fracture right L3 transverse process. 3. No significant lumbar stenosis. Electronically Signed   By: Marlan Palau M.D.   On: 02/20/2020 19:29   CT PELVIS WO CONTRAST  Result Date: 02/20/2020 CLINICAL DATA:  Pelvic trauma. EXAM: CT PELVIS WITHOUT CONTRAST TECHNIQUE: Multidetector CT imaging of the pelvis was performed following the standard protocol without intravenous contrast. COMPARISON:  November 17, 2009 and prior radiograph from 2019. FINDINGS: Urinary Tract: Urinary bladder unremarkable to the extent evaluated on noncontrast imaging. Distal ureters are nondilated. Bowel: No acute bowel process to the extent evaluated. Colonic diverticulosis. Vascular/Lymphatic: Calcified atheromatous plaque of the abdominal aorta. No aneurysmal dilation of distal abdominal aorta. No adenopathy in the pelvis. Reproductive:  Post hysterectomy.  No pelvic mass.  No adnexal mass. Other:  No ascites. Musculoskeletal: Post pinning of the LEFT hip as on previous examinations. Transverse process fracture on the RIGHT at L4. Osteopenia. No acute fracture of the bony pelvis. Hips are located. IMPRESSION: 1. No acute fracture of the bony pelvis. 2. Post pinning of the LEFT hip as on previous  examinations. 3. Transverse process fracture on the RIGHT at L4 with minimal displacement. 4. Aortic atherosclerosis. Aortic Atherosclerosis (ICD10-I70.0). Electronically Signed   By: Donzetta Kohut M.D.   On: 02/20/2020 19:38    Procedures Procedures (including critical care time)  Medications Ordered in ED Medications  lidocaine (LIDODERM) 5 % 1 patch (1 patch Transdermal Patch Applied 02/20/20 1940)  acetaminophen (TYLENOL) tablet 1,000 mg (1,000 mg Oral Given 02/20/20 2051)  ibuprofen (ADVIL) tablet 600 mg (600 mg Oral Given 02/20/20 2051)    ED Course  I have reviewed the triage vital signs and the nursing notes.  Pertinent labs & imaging results that were available during my care of the patient were reviewed by me and considered in my medical decision making (see chart for details).    MDM Rules/Calculators/A&P                          Patient presented for evaluation of back pain after fall.  On exam, patient peers nontoxic.  She does have reproducible pain over midline spine and right low back.  Considering patient's age, will obtain CT of the pelvis and lumbar spine to assess for bony injury.  Patient is neurovascularly intact, doubt cauda equina.  Considering patient's age, will obtain CT head to ensure no intracranial injury.  Pt requesting to not receive any narcotic pain medications.  Lidocaine patch given.   CT head negative for acute findings.  Chest of the viewed interpreted by me, no pneumonia fracture dislocation.  CT lumbar spine shows T12 compression fracture of indeterminate age.  As this is localized with patient's pain is after fall, likely new.  Patient also with  a right L4 transverse process fracture, and skin correlating with patient's pain.  Will consult with neurosurgery for further management, pain control, and ensure patient is able to ambulate prior to discharge.  Case discussed with attending, Dr. Clarene Duke evaluated the patient.  Discussed with K Meyran, APP with  neurosurgery who recommends TLSO and f/u in office in 1 wk.   Pt signed out to Carolynn Serve, MD for f/u on TLSO and ambulation. Plan for d/c when pt can walk.   Final Clinical Impression(s) / ED Diagnoses Final diagnoses:  Compression fracture of T12 vertebra, initial encounter [facility])  Closed fracture of transverse process of lumbar vertebra, initial encounter [facility])  Fall, initial encounter    Rx / DC Orders ED Discharge Orders    None       Alveria Apley, PA-C 02/20/20 2320    Little, Ambrose Finland, MD 02/21/20 1708

## 2020-02-20 NOTE — ED Notes (Signed)
Pt ambulated from bed to hallway with assist of walker, TLSO maintained

## 2020-02-20 NOTE — ED Triage Notes (Signed)
States became dizzy and fell, no LOC, has had dizziness for years

## 2020-02-20 NOTE — ED Notes (Signed)
discharge instructions reviewed with patient daughter in POV at d/c

## 2020-02-20 NOTE — Discharge Instructions (Signed)
Take ibuprofen 3 times a day with meals.  Do not take other anti-inflammatories at the same time (Advil, Motrin, naproxen, Aleve). You may supplement with Tylenol if you need further pain control. Use ice packs or heating pads if this helps control your pain. Wear the brace to help with pain control. Use a walker when walking for pain and stability. Follow-up with the neurosurgeon listed below in 1 week for reevaluation. Return to the emergency room if you develop new numbness, inability to walk, or any new, worsening, concerning symptoms.

## 2020-04-26 ENCOUNTER — Encounter (HOSPITAL_COMMUNITY): Payer: Self-pay

## 2020-04-26 ENCOUNTER — Other Ambulatory Visit: Payer: Self-pay

## 2020-04-26 ENCOUNTER — Emergency Department (HOSPITAL_COMMUNITY)
Admission: EM | Admit: 2020-04-26 | Discharge: 2020-04-27 | Disposition: A | Discharge status: Home / Self Care | Payer: Medicare Other | Attending: Emergency Medicine | Admitting: Emergency Medicine

## 2020-04-26 DIAGNOSIS — Z79899 Other long term (current) drug therapy: Secondary | ICD-10-CM | POA: Diagnosis not present

## 2020-04-26 DIAGNOSIS — R000 Tachycardia, unspecified: Secondary | ICD-10-CM | POA: Diagnosis not present

## 2020-04-26 DIAGNOSIS — M6281 Muscle weakness (generalized): Secondary | ICD-10-CM | POA: Diagnosis not present

## 2020-04-26 DIAGNOSIS — R630 Anorexia: Secondary | ICD-10-CM | POA: Insufficient documentation

## 2020-04-26 DIAGNOSIS — R1012 Left upper quadrant pain: Secondary | ICD-10-CM | POA: Insufficient documentation

## 2020-04-26 DIAGNOSIS — K5900 Constipation, unspecified: Secondary | ICD-10-CM | POA: Diagnosis not present

## 2020-04-26 DIAGNOSIS — R262 Difficulty in walking, not elsewhere classified: Secondary | ICD-10-CM | POA: Insufficient documentation

## 2020-04-26 DIAGNOSIS — R1031 Right lower quadrant pain: Secondary | ICD-10-CM | POA: Insufficient documentation

## 2020-04-26 DIAGNOSIS — R1032 Left lower quadrant pain: Secondary | ICD-10-CM | POA: Diagnosis not present

## 2020-04-26 DIAGNOSIS — Z9181 History of falling: Secondary | ICD-10-CM | POA: Insufficient documentation

## 2020-04-26 DIAGNOSIS — R42 Dizziness and giddiness: Secondary | ICD-10-CM | POA: Insufficient documentation

## 2020-04-26 DIAGNOSIS — Z20822 Contact with and (suspected) exposure to covid-19: Secondary | ICD-10-CM | POA: Insufficient documentation

## 2020-04-26 DIAGNOSIS — R197 Diarrhea, unspecified: Secondary | ICD-10-CM

## 2020-04-26 DIAGNOSIS — R110 Nausea: Secondary | ICD-10-CM | POA: Diagnosis not present

## 2020-04-26 DIAGNOSIS — R2681 Unsteadiness on feet: Secondary | ICD-10-CM | POA: Diagnosis not present

## 2020-04-26 DIAGNOSIS — I10 Essential (primary) hypertension: Secondary | ICD-10-CM | POA: Insufficient documentation

## 2020-04-26 DIAGNOSIS — E871 Hypo-osmolality and hyponatremia: Secondary | ICD-10-CM | POA: Insufficient documentation

## 2020-04-26 MED ORDER — ACETAMINOPHEN 650 MG RE SUPP
650.0000 mg | Freq: Once | RECTAL | Status: AC
  Administered 2020-04-26: 650 mg via RECTAL
  Filled 2020-04-26: qty 1

## 2020-04-26 MED ORDER — ONDANSETRON HCL 4 MG/2ML IJ SOLN
4.0000 mg | Freq: Once | INTRAMUSCULAR | Status: AC
  Administered 2020-04-26: 4 mg via INTRAVENOUS
  Filled 2020-04-26: qty 2

## 2020-04-26 NOTE — ED Provider Notes (Signed)
[facility]-EMERGENCY DEPT Provider Note   CSN: [id number] Arrival date & time: 04/26/20  2118     History Chief Complaint  Patient presents with  . Abdominal Pain  . Emesis    Kim Colon is a 86 y.o. female with a history of Barrett esophagus, left hip fracture s/p ORIF, restrictive lung disease, HTN, HLD, OSA who presents the emergency department from her PCPs office with a chief complaint of constipation.  The patient reports that she has been constipated for several days.  She has not passed more than a small amount of stool.  She reports associated cramping, aching left sided upper and lower abdominal pain as well as some right lower abdominal pain.  She states that she is feeling fullness throughout her abdomen that seems to radiate up into her throat.  Pain is constant and has been worsening since onset.  She is having associated nausea, but no vomiting.  She denies fever, chills, shortness of breath, chest pain, flank pain, dysuria, hematuria, diarrhea, vaginal bleeding or discharge.  The patient has been taking tramadol for back pain for the last week and a half.  She was seen by her PCP, Dr. Clelia Croft, earlier today who advised that she start taking MiraLAX.  She is also advised that she should come to the ER for further evaluation as there is concern for obstruction.  The patient denies a history of bowel obstruction.  She does report that she has had constipation previously from narcotic pain medication and prefers to take Tylenol for pain.  The history is provided by the patient and medical records. No language interpreter was used.       Past Medical History:  Diagnosis Date  . Arthritis   . Barrett esophagus   . Chronic back pain   . Chronic epigastric pain   . Colon polyps   . Daily headache   . Depression   . GERD (gastroesophageal reflux disease)   . Hip fracture (HCC)    left  . History of hiatal hernia   . HTN (hypertension)   .  Hyperlipemia   . Kyphosis   . Osteoporosis with fracture    T12,T11, T10  . Pulmonary nodule 2006  . Restrictive lung disease    "because of the way my back is"  . Shingles   . Sleep apnea    "went thru study; found evidence of sleep apnea; never got a mask" (12/26/2014)  . Vitamin D deficiency disease     Patient Active Problem List   Diagnosis Date Noted  . Dyspnea on exertion   . Other fatigue   . Palpitations   . Fatigue 12/26/2014  . Chest pain 12/26/2014  . Restrictive lung disease 01/05/2012  . GERD (gastroesophageal reflux disease) 01/05/2012  . Obstructive sleep apnea 01/05/2012  . Hypertension 01/05/2012    Past Surgical History:  Procedure Laterality Date  . APPENDECTOMY    . BUNIONECTOMY Right 08/2010  . CATARACT EXTRACTION W/ INTRAOCULAR LENS  IMPLANT, BILATERAL Bilateral   . CHOLECYSTECTOMY OPEN    . DILATION AND CURETTAGE OF UTERUS  1060's   "might have done one when I miscarried"  . ESOPHAGOGASTRODUODENOSCOPY (EGD) WITH ESOPHAGEAL DILATION  X 2?  . FRACTURE SURGERY    . ORIF HIP FRACTURE Left 10/2009  . TOE SURGERY Right 08/2010   "took some bones out of the 2 toes next to my big toe"  . TONSILLECTOMY    . TOTAL ABDOMINAL HYSTERECTOMY  w/BSO  . TUBAL LIGATION  1973  . VERTEBROPLASTY     T8     OB History   No obstetric history on file.     Family History  Problem Relation Age of Onset  . Heart failure Father   . Cancer Father        prostate  . Heart attack Mother   . Parkinsonism Brother   . Diabetes type II Sister   . Osteopenia Daughter   . Osteopenia Daughter   . Stroke Brother   . Heart attack Brother     Social History   Tobacco Use  . Smoking status: Never Smoker  . Smokeless tobacco: Never Used  Substance Use Topics  . Alcohol use: No  . Drug use: No    Home Medications Prior to Admission medications   Medication Sig Start Date End Date Taking? Authorizing Provider  acetaminophen (TYLENOL) 325 MG tablet Take 325 mg  by mouth 3 (three) times daily as needed for mild pain.    Yes [provider]  albuterol (PROAIR HFA) 108 (90 Base) MCG/ACT inhaler Inhale 2 puffs into the lungs every 4 (four) hours as needed for wheezing or shortness of breath. 01/06/18  Yes Leslye PeerByrum, Robert S, MD  calcium carbonate (TUMS - DOSED IN MG ELEMENTAL CALCIUM) 500 MG chewable tablet Chew 1 tablet by mouth daily as needed for heartburn.   Yes [provider]  Calcium Carbonate-Vitamin D (CALCIUM-D) 600-400 MG-UNIT TABS Take 1 capsule by mouth daily at 12 noon.    Yes [provider]  Cholecalciferol (VITAMIN D3) 1000 UNITS CAPS Take 1 capsule by mouth daily at 12 noon.    Yes [provider]  hydrochlorothiazide (HYDRODIURIL) 12.5 MG tablet Take 12.5 mg by mouth daily. 07/06/19  Yes [provider]  KLOR-CON M10 10 MEQ tablet TAKE 1 TABLET BY MOUTH EVERY DAY Patient taking differently: Take 10 mEq by mouth daily.  08/29/19  Yes Nahser, Deloris PingPhilip J, MD  lidocaine (LIDODERM) 5 % Place 2 patches onto the skin daily.  04/23/20  Yes [provider]  metoprolol succinate (TOPROL-XL) 25 MG 24 hr tablet TAKE 1 TABLET BY MOUTH EVERY DAY Patient taking differently: Take 25 mg by mouth daily.  01/17/20  Yes Nahser, Deloris PingPhilip J, MD  Multiple Vitamins-Minerals (COMPLETE MULTIVITAMIN/MINERAL PO) Take 1 capsule by mouth daily at 12 noon.    Yes [provider]  pantoprazole (PROTONIX) 40 MG tablet Take 40 mg by mouth 2 (two) times daily. 10/11/17  Yes [provider]  Polyvinyl Alcohol-Povidone (REFRESH OP) Place 1 drop into both eyes daily as needed (dry eyes).   Yes [provider]  pyridOXINE (VITAMIN B-6) 100 MG tablet Take 100 mg by mouth daily at 12 noon.    Yes [provider]  valsartan (DIOVAN) 160 MG tablet Take 160 mg by mouth daily. 12/11/17  Yes [provider]  vitamin B-12 (CYANOCOBALAMIN) 500 MCG tablet Take 500 mcg by mouth daily at 12 noon.    Yes  [provider]  Spacer/Aero-Holding Chambers (AEROCHAMBER MV) inhaler Use as instructed 04/16/16   Leslye PeerByrum, Robert S, MD    Allergies    Duloxetine, Amlodipine, and Biaxin [clarithromycin]  Review of Systems   Review of Systems  Constitutional: Negative for activity change, chills and fever.  HENT: Negative for congestion and sore throat.   Respiratory: Negative for shortness of breath.   Cardiovascular: Negative for chest pain.  Gastrointestinal: Positive for abdominal pain, constipation and nausea. Negative  for anal bleeding, blood in stool, diarrhea and vomiting.  Genitourinary: Negative for decreased urine volume, dysuria, flank pain, frequency, pelvic pain, vaginal bleeding and vaginal discharge.  Musculoskeletal: Negative for back pain, joint swelling, myalgias, neck pain and neck stiffness.  Skin: Negative for rash.  Allergic/Immunologic: Negative for immunocompromised state.  Neurological: Negative for weakness, numbness and headaches.  Psychiatric/Behavioral: Negative for confusion.    Physical Exam Updated Vital Signs BP 131/66 (BP Location: Left Arm)   Pulse (!) 111   Temp 98.2 F (36.8 C) (Rectal)   Resp 20   SpO2 93%   Physical Exam Vitals and nursing note reviewed.  Constitutional:      General: She is not in acute distress.    Appearance: She is not ill-appearing or toxic-appearing.  HENT:     Head: Normocephalic.     Mouth/Throat:     Mouth: Mucous membranes are moist.     Pharynx: No oropharyngeal exudate or posterior oropharyngeal erythema.  Eyes:     Conjunctiva/sclera: Conjunctivae normal.  Cardiovascular:     Rate and Rhythm: Regular rhythm. Tachycardia present.     Heart sounds: No murmur heard.  No friction rub. No gallop.   Pulmonary:     Effort: Pulmonary effort is normal. No respiratory distress.     Breath sounds: No stridor. No wheezing, rhonchi or rales.  Chest:     Chest wall: No tenderness.  Abdominal:     General: There is  no distension.     Palpations: Abdomen is soft. There is no mass.     Tenderness: There is abdominal tenderness. There is no right CVA tenderness, left CVA tenderness, guarding or rebound.     Hernia: No hernia is present.     Comments: Hyperactive bowel sounds in all 4 quadrants.  She has moderate tenderness to palpation in the left upper and lower quadrant and right lower quadrant.  No tenderness over McBurney's point.  No CVA tenderness bilaterally.  Negative Murphy sign.  Abdomen is mildly distended, but soft.  Musculoskeletal:     Cervical back: Neck supple.  Skin:    General: Skin is warm.     Capillary Refill: Capillary refill takes less than 2 seconds.     Findings: No rash.  Neurological:     Mental Status: She is alert.  Psychiatric:        Behavior: Behavior normal.     ED Results / Procedures / Treatments   Labs (all labs ordered are listed, but only abnormal results are displayed) Labs Reviewed  COMPREHENSIVE METABOLIC PANEL - Abnormal; Notable for the following components:      Result Value   Sodium 128 (*)    Chloride 95 (*)    CO2 21 (*)    Glucose, Bld 122 (*)    All other components within normal limits  CBC WITH DIFFERENTIAL/PLATELET  LACTIC ACID, PLASMA  URINALYSIS, ROUTINE W REFLEX MICROSCOPIC    EKG None  Radiology CT ABDOMEN PELVIS W CONTRAST  Result Date: 04/27/2020 CLINICAL DATA:  Bowel obstruction EXAM: CT ABDOMEN AND PELVIS WITH CONTRAST TECHNIQUE: Multidetector CT imaging of the abdomen and pelvis was performed using the standard protocol following bolus administration of intravenous contrast. CONTRAST:  OMNIPAQUE IOHEXOL 300 MG/ML  SOLN COMPARISON:  02/10/2012 FINDINGS: Lower chest: The visualized lung bases are clear bilaterally. Moderate hiatal hernia is partially visualized. No pericardial effusion. Hepatobiliary: Cholecystectomy has been performed. Mild hepatic steatosis. No enhancing liver lesion. No intra or extrahepatic biliary ductal  dilation. Pancreas: Unremarkable Spleen: Unremarkable Adrenals/Urinary Tract: The adrenal glands are unremarkable. The kidneys are normal in position. Mild asymmetric right renal cortical atrophy. Multiple simple cortical cysts are seen within the kidneys bilaterally. No hydronephrosis. No solid intrarenal masses. No intrarenal or ureteral calculi. The bladder demonstrates a small cystocele secondary to pelvic floor collapse, best noted on sagittal image # 83. Stomach/Bowel: The colon and rectal vault appear fluid-filled, a nonspecific finding that can be seen in the setting of enterocolitis and present clinically as diarrhea. The stomach, small bowel, and large bowel are otherwise unremarkable. Appendix absent. No free intraperitoneal gas or fluid. Vascular/Lymphatic: Moderate aortoiliac atherosclerotic calcification. No aortic aneurysm. No pathologic adenopathy within the abdomen and pelvis. Reproductive: Status post hysterectomy. No adnexal masses. There is moderate pelvic floor descent noted. Other: Pelvic descent results in descent of the anorectal junction. No abdominal wall hernia Musculoskeletal: Left hip pending has been performed. Osseous structures are diffusely osteopenic. Multiple remote appearing compression fractures are seen at the thoracolumbar junction. There is interval development, however, of a remote appearing compression fracture of L3 with approximately 50% loss of height. No significant paravertebral soft tissue swelling is identified to suggest an acute process. IMPRESSION: No evidence of bowel obstruction. Diffusely fluid-filled colon is nonspecific, but can be seen the setting of enterocolitis and may present clinically as diarrhea. Moderate to severe pelvic floor descent with descent of the anorectal junction and small cystocele present. Aortic Atherosclerosis (ICD10-I70.0). Electronically Signed   By: Helyn Numbers MD   On: 04/27/2020 01:45    Procedures Procedures (including  critical care time)  Medications Ordered in ED Medications  sodium chloride (PF) 0.9 % injection (has no administration in time range)  acetaminophen (TYLENOL) suppository 650 mg (650 mg Rectal Given 04/26/20 2348)  ondansetron (ZOFRAN) injection 4 mg (4 mg Intravenous Given 04/26/20 2348)  sodium chloride 0.9 % bolus 1,000 mL (0 mLs Intravenous Stopped 04/27/20 0648)  iohexol (OMNIPAQUE) 300 MG/ML solution 100 mL (100 mLs Intravenous Contrast Given 04/27/20 0101)  sodium chloride 0.9 % bolus 500 mL (0 mLs Intravenous Stopped 04/27/20 0740)    ED Course  I have reviewed the triage vital signs and the nursing notes.  Pertinent labs & imaging results that were available during my care of the patient were reviewed by me and considered in my medical decision making (see chart for details).    MDM Rules/Calculators/A&P                          86 year old female with a history of Barrett esophagus, left hip fracture s/p ORIF, restrictive lung disease, HTN, HLD, OSA who presents to the emergency department with constipation, abdominal pain, nausea.  Patient is mildly tachycardic on arrival.  She is afebrile.  No hypoxia or tachypnea.  Patient was seen and independently evaluated by Dr. Eudelia Bunch, attending physician.   Labs and imaging have been independently reviewed and interpreted by me.  She has mild hyponatremia of 128.  Bicarb is slightly decreased.  I suspect this is secondary to dehydration as her son confirms that she is had poor p.o. intake over the last few days.  She has no leukocytosis.  Lactate is normal.  UA is pending.  CT abdomen pelvis with no evidence of bowel obstruction.  There is a fluid-filled colon that may represent enterocolitis versus clinically representing diarrhea.  There is some moderate to severe pelvic floor descent and a small cystocele present, but this is likely chronic  appearing.  She can follow-up with urogynecology.  Will provide referral.  On reevaluation,  patient has had numerous episodes of diarrhea throughout the night, which I suspect is causing her symptoms.  Doubt ischemic colitis, bowel obstruction, diverticulitis, appendicitis, pyelonephritis, or cholecystitis.  Patient unfortunately still has not provided a urinalysis and still continues to have some mild tachycardia.  Will fluid challenge the patient and give her another 500 cc of fluids.  Will send urinalysis to ensure that she does not have a UTI.  I will also make sure that she is ambulated in the department to make sure that she does have any dizziness or lightheadedness prior to discharge.  Prior to shift change, I updated the patient's son Brett Canales, via phone with this plan.  Patient and her son are agreeable with plan at this time.  Patient care transferred to PA Belaya at the end of my shift to ensure the patient is successfully fluid challenged, follow-up on UA, make sure that she is ambulated given fluid loss from frequent episodes of diarrhea overnight. Patient presentation, ED course, and plan of care discussed with review of all pertinent labs and imaging. Please see his/her note for further details regarding further ED course and disposition.  Final Clinical Impression(s) / ED Diagnoses Final diagnoses:  None    Rx / DC Orders ED Discharge Orders         Ordered    Ambulatory referral to Urogynecology        04/27/20 0800           Barkley Boards, PA-C 04/27/20 0802    Nira Conn, MD 04/27/20 2103

## 2020-04-26 NOTE — ED Triage Notes (Signed)
Pt reports vomiting and unable to have BM for a few days. Pt states her PCP sent her here to rule out a bowel obstruction.

## 2020-04-27 ENCOUNTER — Encounter (HOSPITAL_COMMUNITY): Payer: Self-pay

## 2020-04-27 ENCOUNTER — Emergency Department (HOSPITAL_COMMUNITY): Payer: Medicare Other

## 2020-04-27 LAB — RESP PANEL BY RT-PCR (FLU A&B, COVID) ARPGX2
Influenza A by PCR: NEGATIVE
Influenza B by PCR: NEGATIVE
SARS Coronavirus 2 by RT PCR: NEGATIVE

## 2020-04-27 LAB — URINALYSIS, ROUTINE W REFLEX MICROSCOPIC
Bilirubin Urine: NEGATIVE
Glucose, UA: NEGATIVE mg/dL
Hgb urine dipstick: NEGATIVE
Ketones, ur: NEGATIVE mg/dL
Nitrite: POSITIVE — AB
Protein, ur: NEGATIVE mg/dL
Specific Gravity, Urine: 1.025 (ref 1.005–1.030)
pH: 5 (ref 5.0–8.0)

## 2020-04-27 LAB — CBC WITH DIFFERENTIAL/PLATELET
Abs Immature Granulocytes: 0.03 10*3/uL (ref 0.00–0.07)
Basophils Absolute: 0 10*3/uL (ref 0.0–0.1)
Basophils Relative: 0 %
Eosinophils Absolute: 0 10*3/uL (ref 0.0–0.5)
Eosinophils Relative: 0 %
HCT: 39.9 % (ref 36.0–46.0)
Hemoglobin: 13.8 g/dL (ref 12.0–15.0)
Immature Granulocytes: 0 %
Lymphocytes Relative: 15 %
Lymphs Abs: 1.3 10*3/uL (ref 0.7–4.0)
MCH: 32 pg (ref 26.0–34.0)
MCHC: 34.6 g/dL (ref 30.0–36.0)
MCV: 92.6 fL (ref 80.0–100.0)
Monocytes Absolute: 0.8 10*3/uL (ref 0.1–1.0)
Monocytes Relative: 10 %
Neutro Abs: 6.2 10*3/uL (ref 1.7–7.7)
Neutrophils Relative %: 75 %
Platelets: 279 10*3/uL (ref 150–400)
RBC: 4.31 MIL/uL (ref 3.87–5.11)
RDW: 12 % (ref 11.5–15.5)
WBC: 8.4 10*3/uL (ref 4.0–10.5)
nRBC: 0 % (ref 0.0–0.2)

## 2020-04-27 LAB — COMPREHENSIVE METABOLIC PANEL
ALT: 23 U/L (ref 0–44)
AST: 36 U/L (ref 15–41)
Albumin: 4 g/dL (ref 3.5–5.0)
Alkaline Phosphatase: 126 U/L (ref 38–126)
Anion gap: 12 (ref 5–15)
BUN: 14 mg/dL (ref 8–23)
CO2: 21 mmol/L — ABNORMAL LOW (ref 22–32)
Calcium: 9.7 mg/dL (ref 8.9–10.3)
Chloride: 95 mmol/L — ABNORMAL LOW (ref 98–111)
Creatinine, Ser: 0.75 mg/dL (ref 0.44–1.00)
GFR, Estimated: >60 mL/min (ref >60)
Glucose, Bld: 122 mg/dL — ABNORMAL HIGH (ref 70–99)
Potassium: 3.9 mmol/L (ref 3.5–5.1)
Sodium: 128 mmol/L — ABNORMAL LOW (ref 135–145)
Total Bilirubin: 0.5 mg/dL (ref 0.3–1.2)
Total Protein: 7.2 g/dL (ref 6.5–8.1)

## 2020-04-27 LAB — SODIUM: Sodium: 136 mmol/L (ref 135–145)

## 2020-04-27 LAB — LACTIC ACID, PLASMA: Lactic Acid, Venous: 1.7 mmol/L (ref 0.5–1.9)

## 2020-04-27 MED ORDER — CEPHALEXIN 500 MG PO CAPS
500.0000 mg | ORAL_CAPSULE | Freq: Once | ORAL | Status: AC
  Administered 2020-04-27: 500 mg via ORAL
  Filled 2020-04-27: qty 1

## 2020-04-27 MED ORDER — SODIUM CHLORIDE 0.9 % IV BOLUS
1000.0000 mL | Freq: Once | INTRAVENOUS | Status: AC
  Administered 2020-04-27: 1000 mL via INTRAVENOUS

## 2020-04-27 MED ORDER — SODIUM CHLORIDE (PF) 0.9 % IJ SOLN
INTRAMUSCULAR | Status: AC
  Filled 2020-04-27: qty 50

## 2020-04-27 MED ORDER — SODIUM CHLORIDE 0.9 % IV BOLUS
500.0000 mL | Freq: Once | INTRAVENOUS | Status: AC
  Administered 2020-04-27: 500 mL via INTRAVENOUS

## 2020-04-27 MED ORDER — IOHEXOL 300 MG/ML  SOLN
100.0000 mL | Freq: Once | INTRAMUSCULAR | Status: AC | PRN
  Administered 2020-04-27: 100 mL via INTRAVENOUS

## 2020-04-27 MED ORDER — CEPHALEXIN 500 MG PO CAPS: 500.0000 mg | ORAL_CAPSULE | Freq: Two times a day (BID) | ORAL | 0 refills | Status: AC

## 2020-04-27 MED ORDER — METOPROLOL TARTRATE 25 MG PO TABS
25.0000 mg | ORAL_TABLET | Freq: Once | ORAL | Status: AC
  Administered 2020-04-27: 25 mg via ORAL
  Filled 2020-04-27: qty 1

## 2020-04-27 NOTE — ED Provider Notes (Signed)
Care received from [facility].  Please see her note for full HPI  86 year old female who presents to the ER with complaints of constipation.  She had been instructed by her PCP to start MiraLAX.  Sent by PCP for concern for obstruction as patient had endorsed some nausea but no vomiting.  He had been taking tramadol.  Work-up by prior provider overall reassuring.  CBC was without leukocytosis, normal hemoglobin.  CMP with hyponatremia of 128, Chloride of 95.  CT of the abdomen not evidence of obstruction, there was a moderate to severe pelvic floor descent.  Care received awaiting UA and attempting to ambulate the patient.  I personally reviewed her UA.  She has positive nitrates and trace leukocytes with few bacteria.  Suspect UTI.  Culture sent.  Nursing staff attempted to ambulate the patient, she had significant difficulty transferring even to the bedside commode.  Per discussion with the patient, she has had multiple weeks of dizziness, she had a fall several weeks ago and since then has been having difficulty ambulating.  Given this, we will add on CT of the head, PT OT eval, and patient would likely need to be admitted as per discussion with my supervising attending Dr. Adela Lank.  10 AM: Consulted hospitalist who would like her sodium rechecked.  If this is normal, he states that she is not eligible for admission.  PT/OT eval pending.  CT pending.  She was given something to eat.  10:56AM: Repeat sodium of 136.  Pending PT/OT consult.  CT of the head is normal.  2:07 PM: Spoke with the patient's son Charolett Yarrow.  He states that the patient lives with her husband who also has dementia at home.  He states that they have been taking turns between all 3 of her older and and assisting her.  He states that they have 24-hour care of their parents between the 3 of them.  He does confirm that she has been having a difficult time ambulating.  He states that he will discuss with his 2 sisters to see if they  are comfortable with taking her home oral if they would rather find a SNF for her.  Pending PT consult, will consult transition of care team in order to either facilitate home PT or SNF placement as per discussion with family.  3:08 PM: Patient remains tachycardic with a rate between 110 and 115.  She states that she takes metoprolol daily but has not taken it in the last few days because of how poorly she was feeling.  As per discussion with Dr. Adela Lank, will hold this medication given she is already feeling dizzy.  Still pending PT consult, patient may require admission.  Signed out care to [employer] who will oversee the rest of her care.    Physical Exam  BP (!) 143/67   Pulse (!) 120   Temp 98.2 F (36.8 C) (Rectal)   Resp (!) 24   SpO2 94%   Physical Exam Vitals and nursing note reviewed.  Constitutional:      General: She is not in acute distress.    Appearance: She is not ill-appearing, toxic-appearing or diaphoretic.     Comments: Frail appearing   HENT:     Head: Normocephalic and atraumatic.  Eyes:     Conjunctiva/sclera: Conjunctivae normal.  Cardiovascular:     Rate and Rhythm: Normal rate and regular rhythm.     Heart sounds: Normal heart sounds. No murmur heard.   Pulmonary:  Effort: Pulmonary effort is normal. No respiratory distress.     Breath sounds: Normal breath sounds.  Abdominal:     Palpations: Abdomen is soft.     Tenderness: There is abdominal tenderness.     Hernia: No hernia is present.  Musculoskeletal:     Cervical back: Neck supple.  Skin:    General: Skin is warm and dry.     Coloration: Skin is not mottled.     Findings: No erythema.  Neurological:     Mental Status: She is alert.     ED Course/Procedures    Results for orders placed or performed during the hospital encounter of 04/26/20  Resp Panel by RT-PCR (Flu A&B, Covid) Nasopharyngeal Swab   Specimen: Nasopharyngeal Swab; Nasopharyngeal(NP) swabs in vial transport medium   Result Value Ref Range   SARS Coronavirus 2 by RT PCR NEGATIVE NEGATIVE   Influenza A by PCR NEGATIVE NEGATIVE   Influenza B by PCR NEGATIVE NEGATIVE  CBC with Differential  Result Value Ref Range   WBC 8.4 4.0 - 10.5 K/uL   RBC 4.31 3.87 - 5.11 MIL/uL   Hemoglobin 13.8 12.0 - 15.0 g/dL   HCT 24.2 36 - 46 %   MCV 92.6 80.0 - 100.0 fL   MCH 32.0 26.0 - 34.0 pg   MCHC 34.6 30.0 - 36.0 g/dL   RDW 35.3 61.4 - 43.1 %   Platelets 279 150 - 400 K/uL   nRBC 0.0 0.0 - 0.2 %   Neutrophils Relative % 75 %   Neutro Abs 6.2 1.7 - 7.7 K/uL   Lymphocytes Relative 15 %   Lymphs Abs 1.3 0.7 - 4.0 K/uL   Monocytes Relative 10 %   Monocytes Absolute 0.8 0.1 - 1.0 K/uL   Eosinophils Relative 0 %   Eosinophils Absolute 0.0 0.0 - 0.5 K/uL   Basophils Relative 0 %   Basophils Absolute 0.0 0.0 - 0.1 K/uL   Immature Granulocytes 0 %   Abs Immature Granulocytes 0.03 0.00 - 0.07 K/uL  Comprehensive metabolic panel  Result Value Ref Range   Sodium 128 (L) 135 - 145 mmol/L   Potassium 3.9 3.5 - 5.1 mmol/L   Chloride 95 (L) 98 - 111 mmol/L   CO2 21 (L) 22 - 32 mmol/L   Glucose, Bld 122 (H) 70 - 99 mg/dL   BUN 14 8 - 23 mg/dL   Creatinine, Ser 5.40 0.44 - 1.00 mg/dL   Calcium 9.7 8.9 - 08.6 mg/dL   Total Protein 7.2 6.5 - 8.1 g/dL   Albumin 4.0 3.5 - 5.0 g/dL   AST 36 15 - 41 U/L   ALT 23 0 - 44 U/L   Alkaline Phosphatase 126 38 - 126 U/L   Total Bilirubin 0.5 0.3 - 1.2 mg/dL   GFR, Estimated >76 >19 mL/min   Anion gap 12 5 - 15  Urinalysis, Routine w reflex microscopic  Result Value Ref Range   Color, Urine YELLOW YELLOW   APPearance CLEAR CLEAR   Specific Gravity, Urine 1.025 1.005 - 1.030   pH 5.0 5.0 - 8.0   Glucose, UA NEGATIVE NEGATIVE mg/dL   Hgb urine dipstick NEGATIVE NEGATIVE   Bilirubin Urine NEGATIVE NEGATIVE   Ketones, ur NEGATIVE NEGATIVE mg/dL   Protein, ur NEGATIVE NEGATIVE mg/dL   Nitrite POSITIVE (A) NEGATIVE   Leukocytes,Ua TRACE (A) NEGATIVE   RBC / HPF 0-5 0 - 5  RBC/hpf   WBC, UA 0-5 0 - 5 WBC/hpf   Bacteria,  UA FEW (A) NONE SEEN  Lactic acid, plasma  Result Value Ref Range   Lactic Acid, Venous 1.7 0.5 - 1.9 mmol/L  Sodium  Result Value Ref Range   Sodium 136 135 - 145 mmol/L   CT Head Wo Contrast  Result Date: 04/27/2020 CLINICAL DATA:  Dizziness EXAM: CT HEAD WITHOUT CONTRAST TECHNIQUE: Contiguous axial images were obtained from the base of the skull through the vertex without intravenous contrast. COMPARISON:  02/20/2020 FINDINGS: Brain: There is no acute intracranial hemorrhage, mass effect, or edema. Gray-white differentiation is preserved. There is no extra-axial fluid collection. Prominence of the ventricles and sulci reflects stable parenchymal volume loss. Patchy and confluent areas of hypoattenuation in the supratentorial white matter nonspecific but probably reflect stable chronic microvascular ischemic changes. Vascular: There is atherosclerotic calcification at the skull base. Skull: Calvarium is unremarkable. Sinuses/Orbits: No acute finding. Other: None. IMPRESSION: No acute intracranial abnormality. Stable chronic/nonemergent findings detailed above. Electronically Signed   By: Guadlupe Spanish M.D.   On: 04/27/2020 10:54   CT ABDOMEN PELVIS W CONTRAST  Result Date: 04/27/2020 CLINICAL DATA:  Bowel obstruction EXAM: CT ABDOMEN AND PELVIS WITH CONTRAST TECHNIQUE: Multidetector CT imaging of the abdomen and pelvis was performed using the standard protocol following bolus administration of intravenous contrast. CONTRAST:  OMNIPAQUE IOHEXOL 300 MG/ML  SOLN COMPARISON:  02/10/2012 FINDINGS: Lower chest: The visualized lung bases are clear bilaterally. Moderate hiatal hernia is partially visualized. No pericardial effusion. Hepatobiliary: Cholecystectomy has been performed. Mild hepatic steatosis. No enhancing liver lesion. No intra or extrahepatic biliary ductal dilation. Pancreas: Unremarkable Spleen: Unremarkable Adrenals/Urinary Tract: The  adrenal glands are unremarkable. The kidneys are normal in position. Mild asymmetric right renal cortical atrophy. Multiple simple cortical cysts are seen within the kidneys bilaterally. No hydronephrosis. No solid intrarenal masses. No intrarenal or ureteral calculi. The bladder demonstrates a small cystocele secondary to pelvic floor collapse, best noted on sagittal image # 83. Stomach/Bowel: The colon and rectal vault appear fluid-filled, a nonspecific finding that can be seen in the setting of enterocolitis and present clinically as diarrhea. The stomach, small bowel, and large bowel are otherwise unremarkable. Appendix absent. No free intraperitoneal gas or fluid. Vascular/Lymphatic: Moderate aortoiliac atherosclerotic calcification. No aortic aneurysm. No pathologic adenopathy within the abdomen and pelvis. Reproductive: Status post hysterectomy. No adnexal masses. There is moderate pelvic floor descent noted. Other: Pelvic descent results in descent of the anorectal junction. No abdominal wall hernia Musculoskeletal: Left hip pending has been performed. Osseous structures are diffusely osteopenic. Multiple remote appearing compression fractures are seen at the thoracolumbar junction. There is interval development, however, of a remote appearing compression fracture of L3 with approximately 50% loss of height. No significant paravertebral soft tissue swelling is identified to suggest an acute process. IMPRESSION: No evidence of bowel obstruction. Diffusely fluid-filled colon is nonspecific, but can be seen the setting of enterocolitis and may present clinically as diarrhea. Moderate to severe pelvic floor descent with descent of the anorectal junction and small cystocele present. Aortic Atherosclerosis (ICD10-I70.0). Electronically Signed   By: Helyn Numbers MD   On: 04/27/2020 01:45    Procedures  MDM         Mare Ferrari, PA-C 04/27/20 1510    Melene Plan, DO 04/30/20 2258

## 2020-04-27 NOTE — Evaluation (Signed)
Physical Therapy Evaluation Patient Details Name: Kim Colon MRN: [id number] DOB: 1933/02/03 Today's Date: 04/27/2020   History of Present Illness  Patient is an 86 y.o. female with PMH significant for Barrett esophagus, left hip fracture s/p ORIF in 2011, restrictive lung disease, HTN, HLD, OSA. Patient presents the emergency department from her PCPs office with a chief complaint of constipation.    Clinical Impression  Kim Colon is 86 y.o. female admitted with above HPI and diagnosis. Patient is currently limited by functional impairments below (see PT problem list). Patient lives with her husband and is requires assist for mobility and ADL's from family and home aid at baseline. Pt's son reports pt used to have a home aid M-F for ~8 hours per day however they have not had one for ~2 weeks. He and his sisters have been providing care for his mother and father and are in the process of acquiring another home aid. At this time the patient requires Mod Assist for all mobility and is limited to short gait distance for transfers due to weakness. She is a high fall risk and will benefit from continued skilled PT interventions to address impairments and progress independence with mobility, recommending SNF follow up at this time. Pt's son reports if they can obtain additional support for 24/7 care they would prefer pt to return home with maximal [facility] services. Acute PT will follow and progress as able.     Follow Up Recommendations SNF    Equipment Recommendations  None recommended by PT    Recommendations for Other Services OT consult     Precautions / Restrictions Precautions Precautions: Fall Precaution Comments: pt reports repeated falls Restrictions Weight Bearing Restrictions: No      Mobility  Bed Mobility Overal bed mobility: Needs Assistance Bed Mobility: Supine to Sit;Sit to Supine     Supine to sit: HOB elevated;Mod assist Sit to supine: Mod assist   General bed  mobility comments: Mod assist to bring LE's off EOB and raise trunk upright. Pt required assist to raise LE's onto bed at EOS and control lowering trunk. Max +2 assist to scoot superiorly in bed.    Transfers Overall transfer level: Needs assistance Equipment used: Rolling walker (2 wheeled) Transfers: Sit to/from [employer] Sit to Stand: Mod assist;From elevated surface Stand pivot transfers: Mod assist;From elevated surface       General transfer comment: Mod assist with cues for safe hand placement on RW, pt with flexed posture (hips and trunk) in standing. Mod assist to manage RW and cues step pattern to move bed>BSC.  Ambulation/Gait Ambulation/Gait assistance: Min assist Gait Distance (Feet): 4 Feet Assistive device: Rolling walker (2 wheeled) Gait Pattern/deviations: Step-to pattern Gait velocity: decr   General Gait Details: pt took several small steps forward and then turned to sit EOB, min assist to steady and manage RW.   Stairs            Wheelchair Mobility    Modified Rankin (Stroke Patients Only)       Balance Overall balance assessment: History of Falls;Needs assistance Sitting-balance support: Feet supported;Bilateral upper extremity supported Sitting balance-Leahy Scale: Fair     Standing balance support: During functional activity;Bilateral upper extremity supported Standing balance-Leahy Scale: Poor Standing balance comment: heavy reliance on external support                             Pertinent Vitals/Pain Pain Assessment: Faces Faces  Pain Scale: Hurts a little bit Pain Location: general discomfort Pain Descriptors / Indicators: Discomfort Pain Intervention(s): Limited activity within patient's tolerance;Monitored during session;Repositioned    Home Living Family/patient expects to be discharged to:: Private residence Living Arrangements: Spouse/significant other Available Help at Discharge: Family Type of  Home: House Home Access: Stairs to enter;Ramped entrance   Entrance Stairs-Number of Steps: pt's son reports they use the ramp to help her in/out of home with weelchair. Home Layout: One level Home Equipment: Psychologist, educational - 2 wheels;Shower seat Additional Comments: pt provided some home setting and spoke to pt's son via phone call to calrify. He reports he and his 2 sisters have been alternating care to provide 24/7 assist/supervision.     Prior Function Level of Independence: Needs assistance   Gait / Transfers Assistance Needed: Pt's son reports she has not been walking for ~1 month due to ongoing/repeated falls. patient requires assist for transfers from bed or chair to wheelchair  ADL's / Homemaking Assistance Needed: pt reports she can no longer cook or clean due to weakness and states her husband cannot for his own health reasons. pt's son reports they help with all ADL's and homemaking. Pt cannot get into shower safely so they help with sink bath.         Hand Dominance        Extremity/Trunk Assessment   Upper Extremity Assessment Upper Extremity Assessment: Generalized weakness;Defer to OT evaluation    Lower Extremity Assessment Lower Extremity Assessment: Generalized weakness    Cervical / Trunk Assessment Cervical / Trunk Assessment: Kyphotic  Communication   Communication: No difficulties  Cognition Arousal/Alertness: Awake/alert Behavior During Therapy: WFL for tasks assessed/performed Overall Cognitive Status: No family/caregiver present to determine baseline cognitive functioning                                        General Comments      Exercises     Assessment/Plan    PT Assessment Patient needs continued PT services  PT Problem List Decreased strength;Decreased activity tolerance;Decreased range of motion;Decreased balance;Decreased mobility;Decreased knowledge of use of DME;Decreased safety awareness;Decreased knowledge  of precautions       PT Treatment Interventions DME instruction;Gait training;Functional mobility training;Therapeutic activities;Therapeutic exercise;Balance training;Patient/family education    PT Goals (Current goals can be found in the Care Plan section)  Acute Rehab PT Goals Patient Stated Goal: to get strong enough to go home PT Goal Formulation: With patient Time For Goal Achievement: 05/11/20 Potential to Achieve Goals: Fair    Frequency Min 2X/week   Barriers to discharge        Co-evaluation               AM-PAC PT "6 Clicks" Mobility  Outcome Measure Help needed turning from your back to your side while in a flat bed without using bedrails?: A Little Help needed moving from lying on your back to sitting on the side of a flat bed without using bedrails?: A Lot Help needed moving to and from a bed to a chair (including a wheelchair)?: A Lot Help needed standing up from a chair using your arms (e.g., wheelchair or bedside chair)?: A Lot Help needed to walk in hospital room?: A Little Help needed climbing 3-5 steps with a railing? : Total 6 Click Score: 13    End of Session Equipment Utilized During Treatment: Gait belt Activity  Tolerance: Patient tolerated treatment well Patient left: in bed;with call bell/phone within reach;with bed alarm set Nurse Communication: Mobility status PT Visit Diagnosis: Muscle weakness (generalized) (M62.81);Unsteadiness on feet (R26.81);History of falling (Z91.81);Difficulty in walking, not elsewhere classified (R26.2)    Time: 5397-6734 PT Time Calculation (min) (ACUTE ONLY): 40 min   Charges:   PT Evaluation $PT Eval Low Complexity: 1 Low PT Treatments $Therapeutic Activity: 23-37 mins       Wynn Maudlin, DPT Acute Rehabilitation Services  Office [phone] Pager [phone]  04/27/2020 4:09 PM

## 2020-04-27 NOTE — TOC Initial Note (Addendum)
Transition of Care [facility]) - Initial/Assessment Note    Patient Details  Name: Kim Colon MRN: [id number] Date of Birth: 03/03/1933  [facility] CM/SW Contact:    Elliot Cousin, RN Phone Number: [phone] 04/27/2020, 5:10 PM  Clinical Narrative:                 TOC CM spoke to Rolm Baptise and offered choice for [facility]. Declined SNF at this time. The children are providing 24 hour caregiver in the home between all of them. Contacted [facility] for RW. Pt has RW with seat but does not work well with the DME due to seat. Contacted Amy rep, [facility]. [facility] will review for [facility]. [facility] will deliver to pt's room.   Expected Discharge Plan: Home w Home Health Services Barriers to Discharge: Continued Medical Work up   Patient Goals and CMS Choice Patient states their goals for this hospitalization and ongoing recovery are:: prefers patient comes home with [facility] CMS Medicare.gov Compare Post Acute Care list provided to:: Patient Represenative (must comment) (son) Choice offered to / list presented to : Adult Children  Expected Discharge Plan and Services Expected Discharge Plan: Home w Home Health Services In-house Referral: Clinical Social Work Discharge Planning Services: CM Consult Post Acute Care Choice: Home Health Living arrangements for the past 2 months: Single Family Home                   DME Agency: [employer] Date DME Agency Contacted: 04/27/20 Time DME Agency Contacted: [phone] Representative spoke with at DME Agency: Velna Hatchet HH Arranged: PT, OT, Nurse's Aide, Social Work [employer] Agency: [facility] Date HH Agency Contacted: 04/27/20 Time HH Agency Contacted: [phone] Representative spoke with at [facility] Agency: Mayra Reel  Prior Living Arrangements/Services Living arrangements for the past 2 months: Single Family Home Lives with:: Spouse Patient language and need for interpreter reviewed:: Yes Do you feel safe going back to the place where you  live?: Yes      Need for Family Participation in Patient Care: Yes (Comment) Care giver support system in place?: Yes (comment) Current home services: DME (rollator, cane, bedside commode) Criminal Activity/Legal Involvement Pertinent to Current Situation/Hospitalization: No - Comment as needed  Activities of Daily Living Home Assistive Devices/Equipment: Eyeglasses, Environmental consultant (specify type) (front wheeled walker) ADL Screening (condition at time of admission) Patient's cognitive ability adequate to safely complete daily activities?: Yes Is the patient deaf or have difficulty hearing?: Yes Does the patient have difficulty seeing, even when wearing glasses/contacts?: No Does the patient have difficulty concentrating, remembering, or making decisions?: Yes (gets rattled easily) Patient able to express need for assistance with ADLs?: Yes Does the patient have difficulty dressing or bathing?: Yes Independently performs ADLs?: No Communication: Independent Dressing (OT): Needs assistance Is this a change from baseline?: Change from baseline, expected to last >3 days Grooming: Needs assistance Is this a change from baseline?: Change from baseline, expected to last >3 days Feeding: Needs assistance Is this a change from baseline?: Change from baseline, expected to last >3 days Bathing: Needs assistance Is this a change from baseline?: Change from baseline, expected to last >3 days Toileting: Needs assistance Is this a change from baseline?: Change from baseline, expected to last >3days In/Out Bed: Needs assistance Is this a change from baseline?: Change from baseline, expected to last >3 days Walks in Home: Needs assistance (patient has been furniture surfing) Is this a change from baseline?: Change from baseline,  expected to last >3 days Does the patient have difficulty walking or climbing stairs?: Yes (secondary to back fractures) Weakness of Legs: Both Weakness of Arms/Hands:  None  Permission Sought/Granted Permission sought to share information with : Case Manager, PCP, Family Supports Permission granted to share information with : Yes, Verbal Permission Granted  Share Information with NAME: Rosetta Rupnow  Permission granted to share info w AGENCY: Home Health  Permission granted to share info w Relationship: son  Permission granted to share info w Contact Information: [phone]  Emotional Assessment       Orientation: : Oriented to Self   Psych Involvement: No (comment)  Admission diagnosis:  possible bowl obstruction, has spinal fractures Patient Active Problem List   Diagnosis Date Noted  . Dyspnea on exertion   . Other fatigue   . Palpitations   . Fatigue 12/26/2014  . Chest pain 12/26/2014  . Restrictive lung disease 01/05/2012  . GERD (gastroesophageal reflux disease) 01/05/2012  . Obstructive sleep apnea 01/05/2012  . Hypertension 01/05/2012   PCP:  Martha Clan, MD Pharmacy:   [facility] [address] [place] - [address] [place] [address] Phone: [phone] Fax: [phone]     Social Determinants of Health (SDOH) Interventions    Readmission Risk Interventions No flowsheet data found.

## 2020-04-27 NOTE — Discharge Instructions (Addendum)
Prescription sent to the pharmacy for Keflex. This is an antibiotic to treat the UTI her urine sample showed today.   Hold the blood pressure medicine Valsartan as blood pressure was on the lower side while in the emergency department. It is likely from the diarrhea and lack of drinking fluids since symptom onset.  Check blood pressure at home daily if possible. Resume Valsartan if the top blood pressure number is at least 120. Increase water intake as well.  Recommend follow up with primary care doctor in 2 days for symptom recheck.  Return to the emergency department for any new or worsening symptoms.

## 2020-04-27 NOTE — ED Notes (Signed)
Meal tray at bedside.  

## 2020-04-27 NOTE — ED Notes (Signed)
Bed alarm was placed and activated for pt's safety.  

## 2020-04-27 NOTE — Progress Notes (Signed)
CSW received a call from pt's EDP pt is ready for D/C and pt's son is seeking PCA care in the home for the pt instead  Of SNF placement.  CSW spoke to pt's son Sadia Belfiore at ph: [phone] and provided PCA resources and updated him pt is likely ready for D/C.  CSW updated EDP who confirmed pt is ready for D/C tonight  and EDP states she will update pt's son and let the CSW know if the CSW can assist pt's son w/obtaining PCA services prior to D/C tonight.   CSW standing by should social work needs arise.  CSW will continue to follow for D/C needs.  Dorothe Pea. Lindalee Huizinga  MSW, LCSW, LCAS, CCS Transitions of Care Clinical Social Worker Care Coordination Department Ph: [phone]

## 2020-04-27 NOTE — ED Provider Notes (Signed)
Patient received in sign out pending PT/OT eval.   Please see previous provider notes from M. McDonald PA-C and Carma Lair PA-C to include full HPI and workup.  Work up significant for repeat sodium being WNL.  UA with possible infection, culture sent and prescription sent to the pharmacy for Keflex.  She was tachycardic to 120, has not had any of her home medications for the last 2 days including metoprolol and valsartan.    Physical Exam  BP 110/87 (BP Location: Left Arm)   Pulse (!) 103   Temp 98 F (36.7 C)   Resp (!) 22   SpO2 95%   Physical Exam PE: Constitutional: Frail-appearing elderly female no apparent distress HENT: normocephalic, atraumatic. no cervical adenopathy Cardiovascular: heart rate ranging from 100-115 during exam Pulmonary/Chest: effort normal; breath sounds clear and equal bilaterally; no wheezes or rales Abdominal: soft and non tender.  No peritoneal signs. Musculoskeletal: no edema Neurological: alert with goal directed thinking Skin: warm and dry, no rash, no diaphoresis Psychiatric: normal mood and affect, normal behavior   ED Course/Procedures   Results for orders placed or performed during the hospital encounter of 04/26/20 (from the past 24 hour(s))  CBC with Differential     Status: None   Collection Time: 04/26/20 10:53 PM  Result Value Ref Range   WBC 8.4 4.0 - 10.5 K/uL   RBC 4.31 3.87 - 5.11 MIL/uL   Hemoglobin 13.8 12.0 - 15.0 g/dL   HCT 60.6 36 - 46 %   MCV 92.6 80.0 - 100.0 fL   MCH 32.0 26.0 - 34.0 pg   MCHC 34.6 30.0 - 36.0 g/dL   RDW 00.4 59.9 - 77.4 %   Platelets 279 150 - 400 K/uL   nRBC 0.0 0.0 - 0.2 %   Neutrophils Relative % 75 %   Neutro Abs 6.2 1.7 - 7.7 K/uL   Lymphocytes Relative 15 %   Lymphs Abs 1.3 0.7 - 4.0 K/uL   Monocytes Relative 10 %   Monocytes Absolute 0.8 0.1 - 1.0 K/uL   Eosinophils Relative 0 %   Eosinophils Absolute 0.0 0.0 - 0.5 K/uL   Basophils Relative 0 %   Basophils Absolute 0.0 0.0 - 0.1 K/uL    Immature Granulocytes 0 %   Abs Immature Granulocytes 0.03 0.00 - 0.07 K/uL  Comprehensive metabolic panel     Status: Abnormal   Collection Time: 04/26/20 10:53 PM  Result Value Ref Range   Sodium 128 (L) 135 - 145 mmol/L   Potassium 3.9 3.5 - 5.1 mmol/L   Chloride 95 (L) 98 - 111 mmol/L   CO2 21 (L) 22 - 32 mmol/L   Glucose, Bld 122 (H) 70 - 99 mg/dL   BUN 14 8 - 23 mg/dL   Creatinine, Ser 1.42 0.44 - 1.00 mg/dL   Calcium 9.7 8.9 - 39.5 mg/dL   Total Protein 7.2 6.5 - 8.1 g/dL   Albumin 4.0 3.5 - 5.0 g/dL   AST 36 15 - 41 U/L   ALT 23 0 - 44 U/L   Alkaline Phosphatase 126 38 - 126 U/L   Total Bilirubin 0.5 0.3 - 1.2 mg/dL   GFR, Estimated >32 >02 mL/min   Anion gap 12 5 - 15  Lactic acid, plasma     Status: None   Collection Time: 04/26/20 10:53 PM  Result Value Ref Range   Lactic Acid, Venous 1.7 0.5 - 1.9 mmol/L  Urinalysis, Routine w reflex microscopic     Status:  Abnormal   Collection Time: 04/27/20  7:30 AM  Result Value Ref Range   Color, Urine YELLOW YELLOW   APPearance CLEAR CLEAR   Specific Gravity, Urine 1.025 1.005 - 1.030   pH 5.0 5.0 - 8.0   Glucose, UA NEGATIVE NEGATIVE mg/dL   Hgb urine dipstick NEGATIVE NEGATIVE   Bilirubin Urine NEGATIVE NEGATIVE   Ketones, ur NEGATIVE NEGATIVE mg/dL   Protein, ur NEGATIVE NEGATIVE mg/dL   Nitrite POSITIVE (A) NEGATIVE   Leukocytes,Ua TRACE (A) NEGATIVE   RBC / HPF 0-5 0 - 5 RBC/hpf   WBC, UA 0-5 0 - 5 WBC/hpf   Bacteria, UA FEW (A) NONE SEEN  Resp Panel by RT-PCR (Flu A&B, Covid) Nasopharyngeal Swab     Status: None   Collection Time: 04/27/20  9:41 AM   Specimen: Nasopharyngeal Swab; Nasopharyngeal(NP) swabs in vial transport medium  Result Value Ref Range   SARS Coronavirus 2 by RT PCR NEGATIVE NEGATIVE   Influenza A by PCR NEGATIVE NEGATIVE   Influenza B by PCR NEGATIVE NEGATIVE  Sodium     Status: None   Collection Time: 04/27/20 10:00 AM  Result Value Ref Range   Sodium 136 135 - 145 mmol/L     MDM   Patient received in signout.  Please see previous prior note to include MDM to this point.  PT OT recommending SNF placement.  Discussed with family and they feel they can care for patient at home.  They did ask about increasing home health assistance.  I contacted social work and family has been given resources for such.  Patient was mildly tachycardic, likely thought to be related to she has not had her her medications in 24 hours, dehydration.  Blood pressures were on the softer side so holding valsartan, did give home dose of metoprolol.  On reassessment tachycardia improved now ranging from 94-103.  Patient is agreeable with plan to be discharged home. Social work and home health orders placed. Findings and plan of care discussed with supervising physician Dr. Madilyn Hook.  Discussed strict return precautions with patient and son. Recommend pcp f/u in 2 days for reassessment.  Portions of this note were generated with Scientist, clinical (histocompatibility and immunogenetics). Dictation errors may occur despite best attempts at proofreading.   Shanon Ace, PA-C 04/27/20 1809    Tilden Fossa, MD 04/28/20 [phone]

## 2020-04-27 NOTE — ED Notes (Signed)
PT at bedside.

## 2020-04-29 LAB — URINE CULTURE: Culture: >=100000 — AB

## 2020-04-30 ENCOUNTER — Telehealth: Payer: Self-pay | Admitting: Emergency Medicine

## 2020-04-30 NOTE — Telephone Encounter (Signed)
Post ED Visit - Positive Culture Follow-up  Culture report reviewed by antimicrobial stewardship pharmacist: [facility] []  , Pharm.D. []  Enzo Bi, Pharm.D., BCPS AQ-ID []  , Pharm.D., BCPS []  Celedonio Miyamoto, .D., BCPS []  National, .D., BCPS, AAHIVP []  Georgina Pillion, Pharm.D., BCPS, AAHIVP []  [address], PharmD, BCPS []  , PharmD, BCPS []  [place], PharmD, BCPS []  [address], PharmD []  , PharmD, BCPS []  Estella Husk, PharmD  Pharmacy Team []  Lysle Pearl, PharmD []  , PharmD []  Phillips Climes, PharmD []  , Rph []  Agapito Games) , PharmD []  Verlan Friends, PharmD []  , PharmD [x]  Mervyn Gay, PharmD []  , PharmD []  Vinnie Level, PharmD []  [facility], PharmD []  , PharmD []  Len Childs, PharmD   Positive urine culture Treated with cephalexin, organism sensitive to the same and no further patient follow-up is required at this time.  04/30/2020, 9:51 AM

## 2020-07-03 ENCOUNTER — Other Ambulatory Visit: Payer: Self-pay

## 2020-07-03 MED ORDER — METOPROLOL SUCCINATE ER 25 MG PO TB24
25.0000 mg | ORAL_TABLET | Freq: Every day | ORAL | 0 refills | Status: DC
Start: 2020-07-03 — End: 2020-07-18

## 2020-07-18 ENCOUNTER — Other Ambulatory Visit: Payer: Self-pay | Admitting: Cardiovascular Disease

## 2020-08-23 ENCOUNTER — Other Ambulatory Visit: Payer: Self-pay | Admitting: Cardiovascular Disease

## 2020-09-19 ENCOUNTER — Other Ambulatory Visit: Payer: Self-pay | Admitting: Cardiovascular Disease

## 2020-10-02 ENCOUNTER — Other Ambulatory Visit: Payer: Self-pay | Admitting: Cardiovascular Disease

## 2020-10-13 ENCOUNTER — Other Ambulatory Visit: Payer: Self-pay | Admitting: Cardiovascular Disease

## 2020-10-27 ENCOUNTER — Other Ambulatory Visit: Payer: Self-pay | Admitting: Cardiovascular Disease

## 2020-11-16 ENCOUNTER — Other Ambulatory Visit: Payer: Self-pay | Admitting: Cardiovascular Disease

## 2020-11-28 ENCOUNTER — Other Ambulatory Visit: Payer: Self-pay | Admitting: Cardiovascular Disease

## 2020-12-06 ENCOUNTER — Other Ambulatory Visit: Payer: Self-pay | Admitting: Cardiovascular Disease

## 2020-12-12 ENCOUNTER — Other Ambulatory Visit: Payer: Self-pay | Admitting: Cardiovascular Disease

## 2020-12-18 ENCOUNTER — Other Ambulatory Visit: Payer: Self-pay | Admitting: Cardiovascular Disease

## 2020-12-31 ENCOUNTER — Other Ambulatory Visit: Payer: Self-pay | Admitting: Cardiovascular Disease

## 2021-01-19 ENCOUNTER — Other Ambulatory Visit: Payer: Self-pay | Admitting: Cardiovascular Disease

## 2021-01-22 MED ORDER — POTASSIUM CHLORIDE CRYS ER 10 MEQ PO TBCR: 10.0000 meq | EXTENDED_RELEASE_TABLET | Freq: Every day | ORAL | 0 refills | Status: DC

## 2021-02-01 ENCOUNTER — Other Ambulatory Visit: Payer: Self-pay | Admitting: Cardiovascular Disease

## 2021-02-17 ENCOUNTER — Other Ambulatory Visit: Payer: Self-pay | Admitting: Cardiovascular Disease

## 2021-03-23 ENCOUNTER — Other Ambulatory Visit: Payer: Self-pay | Admitting: Cardiovascular Disease

## 2021-04-02 ENCOUNTER — Other Ambulatory Visit: Payer: Self-pay | Admitting: Cardiovascular Disease

## 2021-08-25 ENCOUNTER — Other Ambulatory Visit: Payer: Self-pay | Admitting: Cardiovascular Disease

## 2022-04-08 ENCOUNTER — Emergency Department (HOSPITAL_BASED_OUTPATIENT_CLINIC_OR_DEPARTMENT_OTHER): Payer: Medicare Other

## 2022-04-08 ENCOUNTER — Encounter (HOSPITAL_BASED_OUTPATIENT_CLINIC_OR_DEPARTMENT_OTHER): Payer: Self-pay

## 2022-04-08 ENCOUNTER — Emergency Department (HOSPITAL_BASED_OUTPATIENT_CLINIC_OR_DEPARTMENT_OTHER)
Admission: EM | Admit: 2022-04-08 | Discharge: 2022-04-09 | Disposition: A | Discharge status: Home / Self Care | Payer: Medicare Other | Attending: Emergency Medicine | Admitting: Emergency Medicine

## 2022-04-08 ENCOUNTER — Other Ambulatory Visit: Payer: Self-pay

## 2022-04-08 DIAGNOSIS — M545 Low back pain, unspecified: Secondary | ICD-10-CM | POA: Insufficient documentation

## 2022-04-08 DIAGNOSIS — M546 Pain in thoracic spine: Secondary | ICD-10-CM | POA: Insufficient documentation

## 2022-04-08 DIAGNOSIS — S20229A Contusion of unspecified back wall of thorax, initial encounter: Secondary | ICD-10-CM

## 2022-04-08 DIAGNOSIS — Y92002 Bathroom of unspecified non-institutional (private) residence single-family (private) house as the place of occurrence of the external cause: Secondary | ICD-10-CM | POA: Diagnosis not present

## 2022-04-08 DIAGNOSIS — W182XXA Fall in (into) shower or empty bathtub, initial encounter: Secondary | ICD-10-CM | POA: Diagnosis not present

## 2022-04-08 DIAGNOSIS — M542 Cervicalgia: Secondary | ICD-10-CM | POA: Insufficient documentation

## 2022-04-08 DIAGNOSIS — R519 Headache, unspecified: Secondary | ICD-10-CM | POA: Diagnosis not present

## 2022-04-08 DIAGNOSIS — I10 Essential (primary) hypertension: Secondary | ICD-10-CM | POA: Diagnosis not present

## 2022-04-08 DIAGNOSIS — S161XXA Strain of muscle, fascia and tendon at neck level, initial encounter: Secondary | ICD-10-CM

## 2022-04-08 DIAGNOSIS — S0990XA Unspecified injury of head, initial encounter: Secondary | ICD-10-CM

## 2022-04-08 DIAGNOSIS — W19XXXA Unspecified fall, initial encounter: Secondary | ICD-10-CM

## 2022-04-08 DIAGNOSIS — Z79899 Other long term (current) drug therapy: Secondary | ICD-10-CM | POA: Insufficient documentation

## 2022-04-08 LAB — CBC
HCT: 39.9 % (ref 36.0–46.0)
Hemoglobin: 13.6 g/dL (ref 12.0–15.0)
MCH: 31.9 pg (ref 26.0–34.0)
MCHC: 34.1 g/dL (ref 30.0–36.0)
MCV: 93.4 fL (ref 80.0–100.0)
Platelets: 194 10*3/uL (ref 150–400)
RBC: 4.27 MIL/uL (ref 3.87–5.11)
RDW: 12.5 % (ref 11.5–15.5)
WBC: 5.6 10*3/uL (ref 4.0–10.5)
nRBC: 0 % (ref 0.0–0.2)

## 2022-04-08 LAB — BASIC METABOLIC PANEL
Anion gap: 11 (ref 5–15)
BUN: 25 mg/dL — ABNORMAL HIGH (ref 8–23)
CO2: 26 mmol/L (ref 22–32)
Calcium: 10.2 mg/dL (ref 8.9–10.3)
Chloride: 101 mmol/L (ref 98–111)
Creatinine, Ser: 1.24 mg/dL — ABNORMAL HIGH (ref 0.44–1.00)
GFR, Estimated: 42 mL/min — ABNORMAL LOW (ref >60)
Glucose, Bld: 105 mg/dL — ABNORMAL HIGH (ref 70–99)
Potassium: 4 mmol/L (ref 3.5–5.1)
Sodium: 138 mmol/L (ref 135–145)

## 2022-04-08 NOTE — ED Triage Notes (Signed)
Pt advises she fell in the bathroom PTA, states she "hit about everything." Advises she's been "real dizzy lately," but -LOC. No blood thinners, no trauma to head. Hx multiple back fx, advises she mostly utilizes transport chair d/t dizziness.  A&O on arrival, NAD during triage

## 2022-04-08 NOTE — ED Provider Notes (Signed)
[facility] EMERGENCY DEPT Provider Note   CSN: [id number] Arrival date & time: 04/08/22  2139     History  Chief Complaint  Patient presents with   Fall    Pt. States she got while while trying to get on the toilet and fee; no LOC, c/o "pain everywhere"    Kim Colon is a 88 y.o. female.  Patient is an 88 year old female with past medical history of compression fractures, GERD, hypertension, and chronic dizziness with limited mobility.  Patient attempted to stand to walk to the bathroom when she became dizzy, then fell backward and struck her head on the toilet.  There was no loss of consciousness.  She is complaining of headache and pain in her neck and back.  She denies other injury.  Pain is worse with movement.  There are no alleviating factors.  The history is provided by the patient.       Home Medications Prior to Admission medications   Medication Sig Start Date End Date Taking? Authorizing Provider  acetaminophen (TYLENOL) 325 MG tablet Take 325 mg by mouth 3 (three) times daily as needed for mild pain.     [provider]  albuterol (PROAIR HFA) 108 (90 Base) MCG/ACT inhaler Inhale 2 puffs into the lungs every 4 (four) hours as needed for wheezing or shortness of breath. 01/06/18   Leslye Peer, MD  calcium carbonate (TUMS - DOSED IN MG ELEMENTAL CALCIUM) 500 MG chewable tablet Chew 1 tablet by mouth daily as needed for heartburn.    [provider]  Calcium Carbonate-Vitamin D (CALCIUM-D) 600-400 MG-UNIT TABS Take 1 capsule by mouth daily at 12 noon.     [provider]  Cholecalciferol (VITAMIN D3) 1000 UNITS CAPS Take 1 capsule by mouth daily at 12 noon.     [provider]  hydrochlorothiazide (HYDRODIURIL) 12.5 MG tablet Take 12.5 mg by mouth daily. 07/06/19   [provider]  lidocaine (LIDODERM) 5 % Place 2 patches onto the skin daily.  04/23/20   [provider]  metoprolol succinate  (TOPROL-XL) 25 MG 24 hr tablet TAKE 1 TABLET BY MOUTH DAILY. PT NEEDS TO SCHEDULE APPT WITH PROVIDER FOR FURTHER REFILLS 12/06/20   Nahser, Deloris Ping, MD  Multiple Vitamins-Minerals (COMPLETE MULTIVITAMIN/MINERAL PO) Take 1 capsule by mouth daily at 12 noon.     [provider]  pantoprazole (PROTONIX) 40 MG tablet Take 40 mg by mouth 2 (two) times daily. 10/11/17   [provider]  Polyvinyl Alcohol-Povidone (REFRESH OP) Place 1 drop into both eyes daily as needed (dry eyes).    [provider]  potassium chloride (KLOR-CON M10) 10 MEQ tablet TAKE 1 TABLET BY MOUTH DAILY. PLEASE MAKE OVERDUE APPT WITH DR. Elease Hashimoto BEFORE ANYMORE REFILLS. THANK 03/25/21   Nahser, Deloris Ping, MD  pyridOXINE (VITAMIN B-6) 100 MG tablet Take 100 mg by mouth daily at 12 noon.     [provider]  Spacer/Aero-Holding Chambers (AEROCHAMBER MV) inhaler Use as instructed 04/16/16   Leslye Peer, MD  valsartan (DIOVAN) 160 MG tablet Take 160 mg by mouth daily. 12/11/17   [provider]  vitamin B-12 (CYANOCOBALAMIN) 500 MCG tablet Take 500 mcg by mouth daily at 12 noon.     [provider]      Allergies    Duloxetine, Amlodipine, and Biaxin [clarithromycin]    Review of Systems   Review of Systems  All other systems reviewed and are negative.   Physical Exam  Updated Vital Signs BP 127/73   Pulse 69   Temp 98.6 F (37 C) (Oral)   Resp 17   SpO2 95%  Physical Exam Vitals and nursing note reviewed.  Constitutional:      General: She is not in acute distress.    Appearance: She is well-developed. She is not diaphoretic.  HENT:     Head: Normocephalic and atraumatic.  Eyes:     Extraocular Movements: Extraocular movements intact.     Pupils: Pupils are equal, round, and reactive to light.  Neck:     Comments: There is tenderness to palpation in the soft tissues of the cervical region.  There is no bony tenderness or step-off. Cardiovascular:     Rate and  Rhythm: Normal rate and regular rhythm.     Heart sounds: No murmur heard.    No friction rub. No gallop.  Pulmonary:     Effort: Pulmonary effort is normal. No respiratory distress.     Breath sounds: Normal breath sounds. No wheezing.  Abdominal:     General: Bowel sounds are normal. There is no distension.     Palpations: Abdomen is soft.     Tenderness: There is no abdominal tenderness.  Musculoskeletal:        General: Normal range of motion.     Cervical back: Normal range of motion and neck supple.     Comments: There is tenderness to palpation in the soft tissues of the upper thoracic region, but no bony tenderness or step-off.  Skin:    General: Skin is warm and dry.  Neurological:     General: No focal deficit present.     Mental Status: She is alert and oriented to person, place, and time.     Cranial Nerves: No cranial nerve deficit.     Motor: No weakness.     ED Results / Procedures / Treatments   Labs (all labs ordered are listed, but only abnormal results are displayed) Labs Reviewed  BASIC METABOLIC PANEL - Abnormal; Notable for the following components:      Result Value   Glucose, Bld 105 (*)    BUN 25 (*)    Creatinine, Ser 1.24 (*)    GFR, Estimated 42 (*)    All other components within normal limits  CBC  URINALYSIS, ROUTINE W REFLEX MICROSCOPIC  CBG MONITORING, ED    EKG None  Radiology CT HEAD WO CONTRAST  Result Date: 04/08/2022 CLINICAL DATA:  Head trauma EXAM: CT HEAD WITHOUT CONTRAST TECHNIQUE: Contiguous axial images were obtained from the base of the skull through the vertex without intravenous contrast. RADIATION DOSE REDUCTION: This exam was performed according to the departmental dose-optimization program which includes automated exposure control, adjustment of the mA and/or kV according to patient size and/or use of iterative reconstruction technique. COMPARISON:  Head CT 04/27/2020 FINDINGS: Brain: No acute territorial infarction,  hemorrhage or intracranial mass. Atrophy. Chronic small vessel ischemic changes of the white matter. The ventricles are slightly enlarged but likely related to atrophy. Vascular: No hyperdense vessels.  Carotid vascular calcification Skull: Normal. Negative for fracture or focal lesion. Sinuses/Orbits: No acute finding. Other: None IMPRESSION: 1. No CT evidence for acute intracranial abnormality. 2. Atrophy and chronic small vessel ischemic changes of the white matter. Electronically Signed   By: Jasmine Pang M.D.   On: 04/08/2022 22:37    Procedures Procedures  {Document cardiac monitor, telemetry assessment procedure when appropriate:1}  Medications Ordered in ED Medications - No data  to display  ED Course/ Medical Decision Making/ A&P                           Medical Decision Making Amount and/or Complexity of Data Reviewed Labs: ordered. Radiology: ordered.   ***  {Document critical care time when appropriate:1} {Document review of labs and clinical decision tools ie heart score, Chads2Vasc2 etc:1}  {Document your independent review of radiology images, and any outside records:1} {Document your discussion with family members, caretakers, and with consultants:1} {Document social determinants of health affecting pt's care:1} {Document your decision making why or why not admission, treatments were needed:1} Final Clinical Impression(s) / ED Diagnoses Final diagnoses:  None    Rx / DC Orders ED Discharge Orders     None

## 2022-04-09 NOTE — Discharge Instructions (Signed)
Continue medications as previously prescribed.  Follow-up with your primary doctor and return to the emergency department if you experience any new and/or concerning symptoms.

## 2022-10-06 ENCOUNTER — Other Ambulatory Visit: Payer: 59

## 2022-10-06 DIAGNOSIS — Z515 Encounter for palliative care: Secondary | ICD-10-CM

## 2022-10-06 NOTE — Progress Notes (Signed)
TELEPHONE ENCOUNTER  Palliative care SW connected with patients daughter, Herbert Seta, to discuss new PC referral and criteria.   Daughter shared patient is mostly WC bound. family seeing some functaionla decline in patient. Patient has 24/7 care. PC referral place to discuss and asssit with GOC and long term planning.   Plan: in person PC visit scheduled for 5/21 @ 0900

## 2022-10-14 ENCOUNTER — Other Ambulatory Visit: Payer: 59

## 2022-10-14 DIAGNOSIS — Z515 Encounter for palliative care: Secondary | ICD-10-CM

## 2022-10-14 NOTE — Progress Notes (Signed)
COMMUNITY PALLIATIVE CARE SW NOTE  PATIENT NAME: Kim Colon DOB: 17-Feb-1933 MRN: [id number]  PRIMARY CARE PROVIDER: Cleatis Polka., MD  RESPONSIBLE PARTY:  Acct ID - Guarantor Home Phone Work Phone Relationship Acct Type  [id number] - Fatima,ALMET* [phone]  Self P/F     [address], [place], [place] [address]     PLAN OF CARE and INTERVENTIONS:              GOALS OF CARE/ ADVANCE CARE PLANNING: Goals include to maximize quality of life.                                                       Code Status: DNR.                               ACD: HCPOA - all 3 children share POA                               GOC: discussed. Patient wishes to avoid hospitalizations and remain in her with children support and transition to hospice when appropriate.  2.        SOCIAL/EMOTIONAL/SPIRITUAL ASSESSMENT/ INTERVENTIONS:         Palliative care encounter: SW completed initial home visit with patient, daughter Gunnar Fusi and son Brett Canales, daughter heather connected via phone. Referral and criteria discussed. Consent form obtained.   Presenting problem: 89 year old female with past medical history of compression fractures, GERD, hypertension, and chronic dizziness with limited mobility. Mild dementia noted on referral from PCP including functional and cognitive decline, with focus on assisting with resources, GOC and long term planning. Pain: patient shares that she has pai in her back mostly and takes Tylenol. Falls: patient and family share that she had falls recently and in the past that has caused some bodily pain.  sleeping:  sleeps alot during the day. Patient reports having sleep disturbances/nightmares, mostly about falling or feeling like she is spinning. Duration: 6 months+  Dizziness: taking meclizine for about 2 yrs. Family and patient share that this is no longer assisting with the symptoms, and she constantly has dizziness even while sitting. Medication review and possible neurology  referral advised of family to discuss with PCP.  Appetite: eats well. No concerns. Patient has been unable to weigh herself due to being unable to stand.  Mobility: patient is WC bound. Patient has not been able to ambulate due to leg weakness and unsteady gait due to dizziness. Family share it has become more straining to get patient out of the home to appointments, as patient requires 2 ppl max assist with transfers. Patient requires MOD-MOD A with ADL's.   DME:  family requesting hospital bed to assist with bed positioning and mobility. Patient requires a hospital bed to alleviate and prevent bed sores as well to keep her head elevated at least 30 degrees to assist with breathing due to dyspnea. Family also mentions the possibility of needing a hoyer lift to assist with transfers, as patient requires her bed to be at least 20in from the fall for fall precautions. Referral request for hospital bed made to PCP.   medications: reviewed. no concerns. pantoprazole 1 tab bid meclizine 25mg  bid metoprolol 25mg   q morning HCTZ 12.5mg  1 tab q morning sertraline 50mg  at HS potassium  10mg  1 tab q morning calcium 650mg  1 daily vitamin B6 and B12  zinc 50mg  valstran stool softner albuterol  Psychosocial assessment: completed.   In home support: Patients children rotate night shifts at her home, patient has a private caregiver during the day for 5 hours.  Transportation: none.  Food: no food insecurities noted  Safety and long term planning: patient feels safe in her home and desires to remain in her home as long as she safely can.    Resources: none. Patient may benefit from in home provider resources, however would like to remain with PCP for now.  SW discussed goals, reviewed care plan, provided emotional support, used active and reflective listening in the form of reciprocity emotional response. Questions and concerns were addressed. The patient was encouraged to call with any additional  questions and/or concerns. PC Provided general support and encouragement, no other unmet needs identified.    PC will follow up in 3-4 weeks.   3.         PATIENT/CAREGIVER EDUCATION/ COPING:     Patient A&O x3 and able to make needs known. Patient able to engage in conversation and answer all questions appropriately. Patient was able to recall short term and long term facts, however she did look to her children for confirmation of her answers during the visit. PHQ9: 0 and GAD-7 is 0. Patients spouse passed a year ago next month, patient expressed prolonged grief symptoms such as dreams of seeing her deceased husband and re-experiencing his passing. Although patient states she does not feel she is ready for hospice, she made comments in reference to end of life stating "I would like to just be able to go to sleep and pass that way" and "I am ready to go whenever the good lord calls me".    Social: patient share that she has lived in her current home for over 50 yrs and is from [place]. Patient is the oldest of 38 children. Patient has 3 children whom all live locally 2 who live on the same land. 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  4.         PERSONAL EMERGENCY PLAN:  Patient will call 9-1-1 for emergencies.    5.         COMMUNITY RESOURCES COORDINATION/ HEALTH CARE NAVIGATION: none   6.      FINANCIAL CONCERNS/NEEDS: None                         Primary Health Insurance: [facility] Medicare  Secondary Health Insurance: medicaid     SOCIAL HX:  Social History   Tobacco Use   Smoking status: Never   Smokeless tobacco: Never  Substance Use Topics   Alcohol use: No    CODE STATUS: DNR ADVANCED DIRECTIVES: Y MOST FORM COMPLETE:  N HOSPICE EDUCATION PROVIDED: Y  PPS:30 %    TIME SPENT: 1 HR  [place], [place]

## 2022-10-15 ENCOUNTER — Telehealth: Payer: Self-pay

## 2022-10-15 NOTE — Telephone Encounter (Signed)
Message left for PCP CMA, Tresa Endo, requesting hospital bed order per family request.

## 2022-10-21 ENCOUNTER — Telehealth: Payer: Self-pay

## 2022-10-21 NOTE — Telephone Encounter (Signed)
PC SW returned TC to patients daughter whom inquired about delivery of hospital bed and which company will be delivering the bed.    TC to patients PCP to inquire about DME provider name/contact info. VM left for kelly, CMA. Awaiting return call.

## 2022-10-27 ENCOUNTER — Telehealth: Payer: Self-pay

## 2022-10-27 NOTE — Telephone Encounter (Signed)
PC SW returned patients daughter, Philis Kendall requesting a trapeze bar ad one half bed rail be ordered for patients new hospital bed.    SW outreached patients PCP CMA, Tresa Endo, to request orders.

## 2022-10-29 ENCOUNTER — Other Ambulatory Visit: Payer: 59

## 2022-10-29 VITALS — BP 124/80 | HR 80 | Temp 97.6°F

## 2022-10-29 DIAGNOSIS — Z515 Encounter for palliative care: Secondary | ICD-10-CM

## 2022-10-29 NOTE — Progress Notes (Signed)
PATIENT NAME: Kim Colon DOB: 1933-05-16 MRN: [id number]  PRIMARY CARE PROVIDER: Cleatis Polka., MD  RESPONSIBLE PARTY:  Acct ID - Guarantor Home Phone Work Phone Relationship Acct Type  [id number] - Caccamo,ALMET* [phone]  Self P/F     [address] [place], [place] [address]   Home visit completed with patient and daughter Kim Colon.  ACP: DNR in place.  Discussed goals of care with patient and daughter.  Patient would like to remain out of the hospital.  Daughter shares comfort is the goal for patient as they have been advised nothing more could be done for patient.   Discussed hospice support as a possible option if patient continues to decline and daughter is open to this.  She feels patient may have given up on living due to the amount of discomfort and worsening weakness she is experiencing.   Depression:  Patient's spouse passed away 2021/12/03.  Daughter shared the 1 year anniversary is approaching.  There is some concern patient is dealing with more sadness with this anniversary.    Functional Status:  Patient is typically wheelchair bound.  She has a grab bar in her room she uses to assist with transfers.  Typically able to sit up in the wheelchair for about 2 hours before needing to lay down and rest.  Patient has 24/7 support in the home typically rotating between 3 children and a hired caregiver that comes 5 hours 5 days a week.  Patient is able to feed herself.  Family does meal prep and she receives meals on wheels.  Patient is mostly continent of bowel and bladder during the day.  Endorses nighttime incontinence.   Over the last 2 days, family has noted increase weakness.  More challenging to assist patient from the bed to wheelchair.  Now has a hospital bed in the home.  This will be moved to her bedroom today.   Infection:  Recently treated for a UTI.  No further s/s of infection noted by Kim Colon.   Nightmares:  Patient experiencing worsening nightmares with flailing arms  and confusion.   Was awake most of the night 2 nights ago.  Slept better last night but took 5 mg of melatonin.   Shortness of breath:  Present with minimal exertion.  Patient has severe osteoporosis with compression fractures and kyphosis per daughter.  Noted restrictive lung disease likely related to kyphosis.  Shortness has been a chronic issue and patient has albuterol inhaler if needed.   Weight Loss:  Last documented weight was 10/29/22 @ 100 lbs 4 ft 11 inches.  Current BMI 20.2.  2 years ago patient was at 115 lbs.   Patient and daughter report a decrease in portion sizes but patient does eat 3 meals a day and occasionally snacks in between meals.    Follow up visit:  Monday, June 10 @ 930 am.    HISTORY OF PRESENT ILLNESS:  89 year old female with Mild Dementia, Kyphosis, Restrictive Lung Disease,Major Depressive Disorder, CKD Stage 3a, HTN and Chronic Pain Syndrome.     CODE STATUS: DNR ADVANCED DIRECTIVES: Not on file MOST FORM: No PPS: 40%   PHYSICAL EXAM:   VITALS: Today's Vitals   10/29/22 1507  BP: 124/80  Pulse: 80  Temp: 97.6 F (36.4 C)  SpO2: 92%    LUNGS: clear to auscultation  CARDIAC: Cor RRR}  EXTREMITIES: - for edema SKIN: Skin color, texture, turgor normal. No rashes or lesions or mobility and turgor normal  NEURO: positive for dizziness, gait problems, memory problems, and weakness       Truitt Merle, RN

## 2022-11-03 ENCOUNTER — Other Ambulatory Visit: Payer: 59

## 2022-11-03 VITALS — BP 106/70 | HR 74 | Resp 18

## 2022-11-03 DIAGNOSIS — Z515 Encounter for palliative care: Secondary | ICD-10-CM

## 2022-11-03 NOTE — Progress Notes (Signed)
[facility]  Follow Up Encounter Note  PATIENT NAME: Kim Colon DOB: [birth year] MRN: [id number]  PRIMARY CARE PROVIDER: Cleatis Polka., MD  RESPONSIBLE PARTY:  Acct ID - Guarantor Home Phone Work Phone Relationship Acct Type  [id number] - Barcelo,ALMET* [phone]  Self P/F     [address], [place], [place] [address]   RN completed home visit. DtrHerbert Seta, also present   HISTORY OF PRESENT ILLNESS:  89 year old female with Mild Dementia, Kyphosis, Restrictive Lung Disease,Major Depressive Disorder, CKD Stage 3a, HTN and Chronic Pain Syndrome.      Functional Status: pt lying in bed (hospital bed) when RN arrived. Pt had been up this morning, but "needed to lie back down." Daughter reports that she is "much better since Raynelle Fanning was here last week", and pt agrees.    Respiratory: Dtr reports that she had audible wheezes last night after supper while sitting at the table. Able to talk in complete sentences this visit. Pt reports that she gets out of breath when she eats and moves around a lot. Uses an inhaler at times.   Sleeping Pattern: Dtr reports that they started giving pt melatonin at night before bed and she has not had any more nightmares. Reports sleeping well, denies napping. Pt states, "I have felt much better in the last several days since I am not having that floating and falling feeling all night."  Appetite: reports eating well this past weekend. No issues with food.   Pain: denies acute pain right now, but states, "my back is tense and achy." Unable to sit up very long. Dtr reports that she lays down a lot to relieve pain.   Goals of Care: Stay at home as long as possible.     CODE STATUS: DNR ADVANCED DIRECTIVES: N MOST FORM: No PPS: 40%  Next appt scheduled: Raynelle Fanning and [place] will call to schedule next follow up appt.    PHYSICAL EXAM:   VITALS: Today's Vitals   11/03/22 1012  BP: 106/70  Pulse: 74  Resp: 18  SpO2: 92%  PainSc: 3   PainLoc:  Back   LUNGS: diminished in bases but otherwise clear.  No wheezing noted. CARDIAC:  regular, no JVD  EXTREMITIES: MAE x 4, no edema       Barbette Merino, RN

## 2023-07-25 DEATH — deceased
